# Patient Record
Sex: Male | Born: 1960 | Race: White | Hispanic: No | Marital: Married | Smoking: Former smoker
Health system: Southern US, Community
[De-identification: ages and names within clinical notes are randomized; demographics above are authoritative.]

## PROBLEM LIST (undated history)

## (undated) DIAGNOSIS — K219 Gastro-esophageal reflux disease without esophagitis: Secondary | ICD-10-CM

## (undated) HISTORY — DX: Gastro-esophageal reflux disease without esophagitis: K21.9

---

## 2006-08-24 ENCOUNTER — Inpatient Hospital Stay: Payer: Self-pay | Admitting: General Surgery

## 2006-08-24 ENCOUNTER — Other Ambulatory Visit: Payer: Self-pay

## 2009-01-12 HISTORY — PX: CHOLECYSTECTOMY: SHX55

## 2009-11-12 ENCOUNTER — Ambulatory Visit: Payer: Self-pay | Admitting: Ophthalmology

## 2011-11-03 ENCOUNTER — Emergency Department: Payer: Self-pay | Admitting: Emergency Medicine

## 2011-11-03 LAB — BASIC METABOLIC PANEL
BUN: 14 mg/dL (ref 4–21)
BUN: 14 mg/dL (ref 7–18)
Creatinine: 0.83 mg/dL (ref 0.60–1.30)
EGFR (African American): >60
EGFR (Non-African Amer.): >60
Glucose: 100 mg/dL — ABNORMAL HIGH (ref 65–99)
Potassium: 3.5 mmol/L (ref 3.5–5.1)
Sodium: 138 mmol/L (ref 136–145)

## 2011-11-03 LAB — CBC
HGB: 14.7 g/dL (ref 13.0–18.0)
MCH: 30.9 pg (ref 26.0–34.0)
MCHC: 33.7 g/dL (ref 32.0–36.0)
MCV: 92 fL (ref 80–100)
RDW: 14 % (ref 11.5–14.5)
WBC: 14.5

## 2011-11-17 ENCOUNTER — Encounter: Payer: Self-pay | Admitting: Family Medicine

## 2011-11-17 ENCOUNTER — Ambulatory Visit (INDEPENDENT_AMBULATORY_CARE_PROVIDER_SITE_OTHER): Payer: BC Managed Care – PPO | Admitting: Family Medicine

## 2011-11-17 VITALS — BP 128/88 | HR 84 | Temp 98.6°F | Ht 71.0 in | Wt 166.0 lb

## 2011-11-17 DIAGNOSIS — K047 Periapical abscess without sinus: Secondary | ICD-10-CM | POA: Insufficient documentation

## 2011-11-17 DIAGNOSIS — K219 Gastro-esophageal reflux disease without esophagitis: Secondary | ICD-10-CM

## 2011-11-17 HISTORY — DX: Periapical abscess without sinus: K04.7

## 2011-11-17 MED ORDER — OMEPRAZOLE 40 MG PO CPDR: 40.0000 mg | DELAYED_RELEASE_CAPSULE | Freq: Every day | ORAL | Status: DC

## 2011-11-17 NOTE — Assessment & Plan Note (Signed)
Will request records today and then fill out FMLA forms he brings. Pending extraction 12/01/2011. Was out of work all last week.

## 2011-11-17 NOTE — Assessment & Plan Note (Signed)
No red flags.  Present for 3 yrs. Discussed GERD precautions. Start omeprazole 40mg  daily.  If persistent, consider EGD. Has failed 2 H2 blockers.

## 2011-11-17 NOTE — Progress Notes (Addendum)
Subjective:    Patient ID: Bradley Gonzales, male    DOB: 1960/07/06, 51 y.o.   MRN: [id number]  HPI CC: new pt to establish  No prior PCP.  Used to see Dr. Vear Clock.  Denies h/o HTN.  GERD - has had GERD sxs for last 3 years.  Has tried OTC meds including - pepcid AC, zantac which only help for a limited amt time.  Mother takes prilosec which works well for him.  Denies early satiety, vomiting or dysphagia.  Last week seen at [facility] on Monday evening, treated for sinusitis with 2 different abx on Monday.  Next day woke up with facial swelling around left eye, evaluated at [facility] ER and stayed for 1/2 day with IV abx home with clindamycin, and stayed out of work the whole week.  Head CT showing dental abscess.  Fever to 102.  BP was very elevated as well.  Planning on tooth pulled 12/01/2011 by dentist in [place].  Back to work this week.  Out of work all last week.  Caffeine: pepsi, tea Lives with wife Dondra Spry) and 1 son Joselyn Glassman), 1 dog Occupation: Receiving clerk  Edu: HS Activity: fishes, walking Diet: good water intake, daily fruits/vegetables  Preventative: No recent CPE Flu shot at work (11/12/2011) Tetanus 2011  Medications and allergies reviewed and updated in chart.  Past histories reviewed and updated if relevant as below. There is no problem list on file for this patient.  Past Medical History  Diagnosis Date  . GERD (gastroesophageal reflux disease)    Past Surgical History  Procedure Date  . Cholecystectomy 2011   History  Substance Use Topics  . Smoking status: Former Smoker    Quit date: 01/12/1994  . Smokeless tobacco: Never Used  . Alcohol Use: Yes     Comment: Occasional   Family History  Problem Relation Age of Onset  . Hypertension Mother   . Cancer Mother     ovarian  . Cancer Paternal Grandfather     ?lung, smoker  . Diabetes Paternal Uncle   . Stroke Neg Hx   . CAD Neg Hx    No Known Allergies No current outpatient prescriptions  on file prior to visit.     Review of Systems  Constitutional: Positive for fever and chills. Negative for activity change, appetite change, fatigue and unexpected weight change.  HENT: Positive for dental problem and sinus pressure. Negative for hearing loss, sore throat, trouble swallowing and neck pain.   Eyes: Negative for visual disturbance.  Respiratory: Negative for cough, chest tightness, shortness of breath and wheezing.   Cardiovascular: Negative for chest pain, palpitations and leg swelling.  Gastrointestinal: Negative for nausea, vomiting, abdominal pain, diarrhea, constipation, blood in stool and abdominal distention.  Genitourinary: Negative for hematuria and difficulty urinating.  Musculoskeletal: Negative for myalgias and arthralgias.  Skin: Negative for rash.  Neurological: Negative for dizziness, seizures, syncope and headaches.  Hematological: Does not bruise/bleed easily.  Psychiatric/Behavioral: Negative for dysphoric mood. The patient is not nervous/anxious.        Objective:   Physical Exam  Nursing note and vitals reviewed. Constitutional: He is oriented to person, place, and time. He appears well-developed and well-nourished. No distress.  HENT:  Head: Normocephalic and atraumatic.  Right Ear: Hearing, tympanic membrane, external ear and ear canal normal.  Left Ear: Hearing, tympanic membrane, external ear and ear canal normal.  Nose: Nose normal.  Mouth/Throat: Oropharynx is clear and moist. No oropharyngeal exudate.  Poor dentition.  Upper partial dentures  Eyes: Conjunctivae normal and EOM are normal. Pupils are equal, round, and reactive to light. No scleral icterus.  Neck: Normal range of motion. Neck supple. No thyromegaly present.  Cardiovascular: Normal rate, regular rhythm, normal heart sounds and intact distal pulses.   No murmur heard. Pulses:      Radial pulses are 2+ on the right side, and 2+ on the left side.  Pulmonary/Chest: Effort  normal and breath sounds normal. No respiratory distress. He has no wheezes. He has no rales.  Abdominal: Soft. Bowel sounds are normal. He exhibits no distension and no mass. There is no tenderness. There is no rebound and no guarding.  Musculoskeletal: Normal range of motion. He exhibits no edema.  Lymphadenopathy:    He has no cervical adenopathy.  Neurological: He is alert and oriented to person, place, and time.       CN grossly intact, station and gait intact  Skin: Skin is warm and dry. No rash noted.  Psychiatric: He has a normal mood and affect. His behavior is normal. Judgment and thought content normal.       Assessment & Plan:  ADDENDUM ==> reviewed records from [facility] - Dx with dental abscess, treated with clindamycin, rec f/u with dentist.   CT maxiollofacial - L medial incisor 14mm abscess.  L preseptal cellulitis. WBC 14.5, glu 100.

## 2011-11-17 NOTE — Patient Instructions (Addendum)
Sign release of records form for records from [facility] in [place] and [facility] ER visit. I will then fill out forms. Good to meet you today, return as needed. Return at your convenience fasting for blood work and afterwards for physical.  For reflux: Head of bed elevated. Avoidance of citrus, fatty foods, chocolate, peppermint, and excessive alcohol, along with sodas, orange juice (acidic drinks) At least a few hours between dinner and bed, minimize naps after eating.

## 2011-11-23 ENCOUNTER — Telehealth: Payer: Self-pay

## 2011-11-23 NOTE — Telephone Encounter (Signed)
filled and placed in Kim[employer] box [employer] form has 2 highlighted areas that need to be filled out. Also plz clarify with pt - seen at [facility] ER 11/03/2011, so stayed out of work for 2 wks?  (i thought only 1)

## 2011-11-23 NOTE — Telephone Encounter (Signed)
Pt left v/m requesting paper work left at 11/17/11 for human resources. Call pt with update.

## 2011-11-23 NOTE — Telephone Encounter (Signed)
Spoke with patient and made aware that Dr. Reece Agar out of office and was needing additional info prior to filling out forms. Once that info was received and Dr. Reece Agar back in office, paperwork would be completed and patient would be notified. Patient verbalized understanding and will await call. He did want Dr. Reece Agar to be aware that he found out HTN runs in his family.

## 2011-11-25 NOTE — Telephone Encounter (Signed)
Spoke with patient and he confirmed he was only out of work x 1 week. Is there anything that needs to be changed prior to faxing paperwork?

## 2011-11-25 NOTE — Telephone Encounter (Signed)
No mailbox on patient's cell. Message left for patient's wife to have patient call and advise specific dates that he was out of work due to telling me out 1 week and telling Dr. Reece Agar out 2. Advised to leave message on my VM if I didn't answer. Will await return call.

## 2011-11-25 NOTE — Telephone Encounter (Signed)
Spoke with Bradley Gonzales about this.

## 2011-11-26 NOTE — Telephone Encounter (Signed)
Patient called and said he was out of work from October 21-25. He went to [facility] in [place] on the 21st and then to [facility] on the 22nd which is when the ER doctor put him out of work for the rest of the week.

## 2011-11-26 NOTE — Telephone Encounter (Signed)
Noted. Thanks.  plz fax with change in form.

## 2011-11-26 NOTE — Telephone Encounter (Signed)
Forms faxed to [facility] rep for patient's employer.

## 2011-12-24 ENCOUNTER — Encounter: Payer: Self-pay | Admitting: Family Medicine

## 2012-02-06 ENCOUNTER — Other Ambulatory Visit: Payer: Self-pay | Admitting: Family Medicine

## 2012-02-06 DIAGNOSIS — Z0000 Encounter for general adult medical examination without abnormal findings: Secondary | ICD-10-CM

## 2012-02-11 ENCOUNTER — Other Ambulatory Visit (INDEPENDENT_AMBULATORY_CARE_PROVIDER_SITE_OTHER): Payer: BC Managed Care – PPO

## 2012-02-11 DIAGNOSIS — Z0000 Encounter for general adult medical examination without abnormal findings: Secondary | ICD-10-CM

## 2012-02-11 LAB — LIPID PANEL
Cholesterol: 183 mg/dL (ref 0–200)
LDL Cholesterol: 101 mg/dL — ABNORMAL HIGH (ref 0–99)
Triglycerides: 191 mg/dL — ABNORMAL HIGH (ref 0.0–149.0)

## 2012-02-11 LAB — BASIC METABOLIC PANEL
BUN: 21 mg/dL (ref 6–23)
CO2: 28 mEq/L (ref 19–32)
Chloride: 108 mEq/L (ref 96–112)
Creatinine, Ser: 0.9 mg/dL (ref 0.4–1.5)

## 2012-02-16 ENCOUNTER — Encounter: Payer: Self-pay | Admitting: Family Medicine

## 2012-02-16 ENCOUNTER — Ambulatory Visit (INDEPENDENT_AMBULATORY_CARE_PROVIDER_SITE_OTHER): Payer: BC Managed Care – PPO | Admitting: Family Medicine

## 2012-02-16 VITALS — BP 118/76 | HR 76 | Temp 98.3°F | Ht 71.5 in | Wt 173.2 lb

## 2012-02-16 DIAGNOSIS — K219 Gastro-esophageal reflux disease without esophagitis: Secondary | ICD-10-CM

## 2012-02-16 DIAGNOSIS — Z0000 Encounter for general adult medical examination without abnormal findings: Secondary | ICD-10-CM

## 2012-02-16 DIAGNOSIS — R7309 Other abnormal glucose: Secondary | ICD-10-CM

## 2012-02-16 DIAGNOSIS — R739 Hyperglycemia, unspecified: Secondary | ICD-10-CM | POA: Insufficient documentation

## 2012-02-16 MED ORDER — OMEPRAZOLE 20 MG PO CPDR: 20.0000 mg | DELAYED_RELEASE_CAPSULE | Freq: Every day | ORAL | Status: DC

## 2012-02-16 NOTE — Assessment & Plan Note (Signed)
Great control on omeprazole 40mg  daily - will do trial of lower dose to see if adequate control.

## 2012-02-16 NOTE — Assessment & Plan Note (Signed)
Slight elevated fasting sugar - discussed diminishing added salt.

## 2012-02-16 NOTE — Progress Notes (Signed)
Subjective:    Patient ID: Bradley Gonzales., male    DOB: 1960-02-22, 52 y.o.   MRN: [id number]  HPI CC: CPE  Nosebleeds - sleeps with fan on, electric heater.  Comes and goes.  Goes away after a few minutes.  No other easy bruising or bleeding.  Seat belt and sunscreen use discussed.  Caffeine: pepsi, tea  Lives with wife Dondra Spry) and 1 son Joselyn Glassman), 1 dog  Occupation: Receiving clerk  Edu: HS  Activity: fishes, walking Diet: good water intake, daily fruits/vegetables  Preventative:  No recent CPE  Flu shot at work (11/12/2011)  Tetanus 2011 Colon/prostate cancer screening - no BM changes, no blood in stool.  Medications and allergies reviewed and updated in chart.  Past histories reviewed and updated if relevant as below. Patient Active Problem List  Diagnosis  . GERD (gastroesophageal reflux disease)  . Dental abscess   Past Medical History  Diagnosis Date  . GERD (gastroesophageal reflux disease)    Past Surgical History  Procedure Date  . Cholecystectomy 2011   History  Substance Use Topics  . Smoking status: Former Smoker    Quit date: 01/12/1994  . Smokeless tobacco: Never Used  . Alcohol Use: Yes     Comment: Occasional   Family History  Problem Relation Age of Onset  . Hypertension Mother   . Cancer Mother     ovarian  . Cancer Paternal Grandfather     ?lung, smoker  . Diabetes Paternal Uncle   . Stroke Neg Hx   . CAD Neg Hx    No Known Allergies Current Outpatient Prescriptions on File Prior to Visit  Medication Sig Dispense Refill  . omeprazole (PRILOSEC) 20 MG capsule Take 1 capsule (20 mg total) by mouth daily.  30 capsule  11     Review of Systems  Constitutional: Negative for fever, chills, appetite change, fatigue and unexpected weight change.  HENT: Negative for hearing loss, sore throat, trouble swallowing and neck pain.   Eyes: Negative for visual disturbance.  Respiratory: Negative for cough, chest tightness, shortness of breath  and wheezing.   Cardiovascular: Negative for chest pain, palpitations and leg swelling.  Gastrointestinal: Negative for nausea, vomiting, abdominal pain, diarrhea, constipation, blood in stool and abdominal distention.  Genitourinary: Negative for hematuria and difficulty urinating.  Musculoskeletal: Negative for myalgias and arthralgias.  Skin: Negative for rash.  Neurological: Negative for dizziness, seizures, syncope and headaches.  Hematological: Does not bruise/bleed easily.  Psychiatric/Behavioral: Negative for dysphoric mood. The patient is not nervous/anxious.        Objective:   Physical Exam  Nursing note and vitals reviewed. Constitutional: He is oriented to person, place, and time. He appears well-developed and well-nourished. No distress.  HENT:  Head: Normocephalic and atraumatic.  Right Ear: Hearing, tympanic membrane, external ear and ear canal normal.  Left Ear: Hearing, tympanic membrane, external ear and ear canal normal.  Nose: Nose normal.  Mouth/Throat: Oropharynx is clear and moist. No oropharyngeal exudate.  Eyes: Conjunctivae normal and EOM are normal. Pupils are equal, round, and reactive to light. No scleral icterus.  Neck: Normal range of motion. Neck supple.  Cardiovascular: Normal rate, regular rhythm, normal heart sounds and intact distal pulses.   No murmur heard. Pulses:      Radial pulses are 2+ on the right side, and 2+ on the left side.  Pulmonary/Chest: Effort normal and breath sounds normal. No respiratory distress. He has no wheezes. He has no rales.  Abdominal: Soft.  Bowel sounds are normal. He exhibits no distension and no mass. There is no tenderness. There is no rebound and no guarding.  Musculoskeletal: Normal range of motion. He exhibits no edema.  Lymphadenopathy:    He has no cervical adenopathy.  Neurological: He is alert and oriented to person, place, and time.       CN grossly intact, station and gait intact  Skin: Skin is warm and  dry. No rash noted.  Psychiatric: He has a normal mood and affect. His behavior is normal. Judgment and thought content normal.       Assessment & Plan:

## 2012-02-16 NOTE — Assessment & Plan Note (Signed)
Preventative protocols reviewed and updated unless pt declined. Discussed healthy diet and lifestyle. rtc 1 yr for next CPE.

## 2012-02-16 NOTE — Patient Instructions (Signed)
I'm glad reflux is doing well.  Let's try lower dose of omeprazole 20mg  daily - samples provided today. Sugar was a bit high - watch added sugars. Good to see you today, call us with questions.

## 2012-04-05 ENCOUNTER — Telehealth: Payer: Self-pay | Admitting: Family Medicine

## 2012-04-05 NOTE — Telephone Encounter (Signed)
Patient Information:  Caller Name: T[place]  Phone: [phone]  Patient: Bradley Gonzales, Bradley Gonzales  Gender: Male  DOB: [birth year]  Age: 52 Years  PCP: Eustaquio Boyden [facility])  Office Follow Up:  Does the office need to follow up with this patient?: No  Instructions For The Office: N/A  RN Note:  Patient has sore throat, nasal congestion.  Symptoms  Reason For Call & Symptoms: Cough and sinus congestion  Reviewed Health History In EMR: Yes  Reviewed Medications In EMR: Yes  Reviewed Allergies In EMR: Yes  Reviewed Surgeries / Procedures: Yes  Date of Onset of Symptoms: 04/03/2012  Treatments Tried: alka Seltzer, Dayquil  Treatments Tried Worked: No  Guideline(s) Used:  Colds  Disposition Per Guideline:   Home Care  Reason For Disposition Reached:   Colds with no complications  Advice Given:  For a Stuffy Nose - Use Nasal Washes:  Introduction: Saline (salt water) nasal irrigation (nasal wash) is an effective and simple home remedy for treating stuffy nose and sinus congestion. The nose can be irrigated by pouring, spraying, or squirting salt water into the nose and then letting it run back out.  Step-By-Step Instructions:   Step 1: Lean over a sink.  Step 2: Gently squirt or spray warm salt water into one of your nostrils.  Step 3: Some of the water may run into the back of your throat. Spit this out. If you swallow the salt water it will not hurt you.  Step 4: Blow your nose to clean out the water and mucus.  Step 5: Repeat steps 1-4 for the other nostril. You can do this a couple times a day if it seems to help you.  Humidifier:  If the air in your home is dry, use a cool-mist humidifier  Call Back If:  Difficulty breathing occurs  Nasal discharge lasts more than 10 days  Cough lasts more than 3 weeks  You become worse  Patient Will Follow Care Advice:  YES

## 2012-04-05 NOTE — Telephone Encounter (Signed)
Noted. Thanks.

## 2012-04-06 ENCOUNTER — Encounter: Payer: Self-pay | Admitting: Family Medicine

## 2012-04-06 ENCOUNTER — Ambulatory Visit (INDEPENDENT_AMBULATORY_CARE_PROVIDER_SITE_OTHER): Payer: BC Managed Care – PPO | Admitting: Family Medicine

## 2012-04-06 ENCOUNTER — Encounter: Payer: Self-pay | Admitting: *Deleted

## 2012-04-06 VITALS — BP 134/96 | HR 82 | Temp 99.2°F | Wt 174.0 lb

## 2012-04-06 DIAGNOSIS — J209 Acute bronchitis, unspecified: Secondary | ICD-10-CM | POA: Insufficient documentation

## 2012-04-06 MED ORDER — GUAIFENESIN-CODEINE 100-10 MG/5ML PO SYRP: 5.0000 mL | ORAL_SOLUTION | Freq: Every evening | ORAL | Status: DC | PRN

## 2012-04-06 MED ORDER — AZITHROMYCIN 250 MG PO TABS: ORAL_TABLET | ORAL | Status: DC

## 2012-04-06 NOTE — Patient Instructions (Addendum)
You have a sinus infection, likely viral. Push fluids and plenty of rest. Use ibuprofen for inflammation present today. Nasal saline irrigation or neti pot to help drain sinuses. May use plain mucinex or immediate release guaifenesin with plenty of fluid to help mobilize mucous (this is over the counter) Prescription cough syrup at night time (because it can make you sleepy) If not improving with above or any worsening, fill zpack antibiotic. Let us know if fever >101.5, worsening cough.

## 2012-04-06 NOTE — Assessment & Plan Note (Signed)
With sinusitis component. Treat with supportive care as per instructions. If not improving or any worsening, fill zpack to cover atypical bronchitis. Pt agrees with plan.

## 2012-04-06 NOTE — Progress Notes (Signed)
  Subjective:    Patient ID: Bradley Mayers., male    DOB: 03-14-1960, 52 y.o.   MRN: [id number]  HPI CC: cough, feeling ill  5d h/o sinus congestion, productive cough and spitting up mucous.  Purulent drainage.  Headache, chest congestion.  + PNdrainage.  Having chest burning and tightness with coughing as well as some wheezing   Heavy eyes.    Has tried pseudophed, alka seltzer, neti pot.  No fevers/chills, ST, ear or tooth pain, abd pain, nausea.  No sick contacts at home. No smokers at home. No h/o asthma/COPD/allergies  Past Medical History  Diagnosis Date  . GERD (gastroesophageal reflux disease)      Review of Systems Per HPI    Objective:   Physical Exam  Nursing note and vitals reviewed. Constitutional: He appears well-developed and well-nourished. No distress.  HENT:  Head: Normocephalic and atraumatic.  Right Ear: Hearing, tympanic membrane, external ear and ear canal normal.  Left Ear: Hearing, tympanic membrane, external ear and ear canal normal.  Nose: Mucosal edema present. No rhinorrhea. Right sinus exhibits no maxillary sinus tenderness and no frontal sinus tenderness. Left sinus exhibits no maxillary sinus tenderness and no frontal sinus tenderness.  Mouth/Throat: Uvula is midline and mucous membranes are normal. Posterior oropharyngeal edema and posterior oropharyngeal erythema present. No oropharyngeal exudate or tonsillar abscesses.  fluid behind TMs bilaterally Nasal mucosal erythema and edema  Eyes: Conjunctivae and EOM are normal. Pupils are equal, round, and reactive to light. No scleral icterus.  Neck: Normal range of motion. Neck supple.  Cardiovascular: Normal rate, regular rhythm, normal heart sounds and intact distal pulses.   No murmur heard. Pulmonary/Chest: Effort normal and breath sounds normal. No respiratory distress. He has no wheezes. He has no rales.  clear  Lymphadenopathy:    He has no cervical adenopathy.  Skin: Skin is warm and  dry. No rash noted.      Assessment & Plan:

## 2012-09-22 ENCOUNTER — Ambulatory Visit (INDEPENDENT_AMBULATORY_CARE_PROVIDER_SITE_OTHER): Payer: BC Managed Care – PPO | Admitting: Family Medicine

## 2012-09-22 ENCOUNTER — Encounter: Payer: Self-pay | Admitting: Family Medicine

## 2012-09-22 VITALS — BP 144/100 | HR 72 | Temp 98.9°F | Wt 175.5 lb

## 2012-09-22 DIAGNOSIS — R351 Nocturia: Secondary | ICD-10-CM

## 2012-09-22 DIAGNOSIS — Z125 Encounter for screening for malignant neoplasm of prostate: Secondary | ICD-10-CM

## 2012-09-22 DIAGNOSIS — N400 Enlarged prostate without lower urinary tract symptoms: Secondary | ICD-10-CM

## 2012-09-22 MED ORDER — TAMSULOSIN HCL 0.4 MG PO CAPS: 0.4000 mg | ORAL_CAPSULE | Freq: Every day | ORAL | Status: DC

## 2012-09-22 NOTE — Assessment & Plan Note (Signed)
Describes nocturia x 2-3 times.  No daytime sxs. Check UA next visit if no improvement. Somewhat enlarged prostate today. Trial of flomax.

## 2012-09-22 NOTE — Assessment & Plan Note (Signed)
See above. Trial of flomax. update with effect. Consider cialis as pt endorses ED.

## 2012-09-22 NOTE — Patient Instructions (Addendum)
I think your do have enlarged prostate. Try flomax once daily to help with night urination. Blood test today to check prostate.  Benign Prostatic Hypertrophy  The prostate gland is part of the reproductive system of men. A normal prostate is about the size and shape of a walnut. The prostate gland makes a fluid that is mixed with sperm to make semen. This gland surrounds the urethra and is located in front of the rectum and just below the bladder. The bladder is where urine is stored. The urethra is the tube through which urine passes from the bladder to get out of the body. The prostate grows as a man ages. An enlarged prostate not caused by cancer is called benign prostatic hypertrophy (BPH). This is a common health problem in men over age 50. This condition is a normal part of aging. An enlarged prostate presses on the urethra. This makes it harder to pass urine. In the early stages of enlargement, the bladder can get by with a narrowed urethra by forcing the urine through. If the problem gets worse, medical or surgical treatment may be required.  This condition should be followed by your caregiver. Longstanding back pressure on the kidneys can cause infection. Back pressure and infection can progress to bladder damage and kidney (renal) failure. If needed, your caregiver may refer you to a specialist in kidney and prostate disease (urologist). CAUSES  The exact cause is not known.  SYMPTOMS   You are not able to completely empty your bladder.  Getting up often during the night to urinate.  Need to urinate frequently during the day.  Difficultly in starting urine flow.  Decrease in size and strength of the urine stream.  Dribbling after urination.  Pain on urination (more common with infection).  Inability to pass your water. This needs immediate treatment. DIAGNOSIS  These tests will help your caregiver understand your problem:  Digital rectal exam (DRE). In a rectal exam, your  caregiver checks your prostate by putting a gloved, lubricated finger into the rectum to feel the back of your prostate gland. This exam detects the size of the gland and abnormal lumps or growths.  Urinalysis (exam of the urine). This may include a culture if there is concern about infection.  Prostate Specific Antigen (PSA). This is a blood test used to screen for prostate cancer. It is not used alone for diagnosing prostate cancer.  Rectal ultrasound (sonogram). This test uses sound waves to electronically produce a "picture" of the prostate. It helps examine the prostate gland for cancer. TREATMENT  Mild symptoms may not need treatment. Simple observation and yearly exams may be all that is required. Medications and surgery are options for more severe problems. Your caregiver can help you make an informed decision for what is best. Two classes of medications are available for relief of prostate symptoms:  Medications that shrink the prostate. This helps relieve symptoms.  Uncommon side effects include problems with sexual function.  Medications to relax the muscle of the prostate. This also relieves the obstruction.  Side effects can include dizziness, fatigue, lightheadedness, and retrograde ejaculation (diminished volume of ejaculate). Several types of surgical treatments are available for relief of prostate symptoms:  Transurethral resection of the prostate (TURP). In this treatment, an instrument is inserted through opening at the tip of the penis. It is used to cut away pieces of the inner core of the prostate. The pieces are removed through the same opening of the penis. This removes the  obstruction and helps get rid of the symptoms.  Transurethral incision (TUIP). In this procedure, small cuts are made in the prostate. This lessens the prostates pressure on the urethra.  Transurethral microwave thermotherapy (TUMT). This procedure uses microwaves to create heat. The heat destroys  and removes a small amount of prostate tissue.  Transurethral needle ablation (TUNA). This is a procedure that uses radio frequencies to do the same as TUMT.  Interstitial laser coagulation (ILC). This is a procedure that uses a laser to do the same as TUMT and TUNA.  Transurethral electrovaporization (TUVP). This is a procedure that uses electrodes to do the same as the procedures listed above. Regardless of the method of treatment chosen, you and your caregiver will discuss the options. With this knowledge, you along with your caregiver can decide upon the best treatment for you. SEEK MEDICAL CARE IF:   You develop chills, fever of 100.5 F (38.1 C), or night sweats.  There is unexplained back pain.  Symptoms are not helped by medications prescribed.  You develop medication side effects.  Your urine becomes very dark or has a bad smell. SEEK IMMEDIATE MEDICAL CARE IF:   You are suddenly unable to urinate. This is an emergency. You should be seen immediately.  There are large amounts of blood or clots in the urine.  Your urinary problems become unmanageable.  You develop lightheadedness, severe dizziness, or you feel faint.  You develop moderate to severe low back or flank pain.  You develop chills or fever. Document Released: 12/29/2004 Document Revised: 03/23/2011 Document Reviewed: 09/20/2006 [facility] Patient Information 2014 [place], [place].

## 2012-09-22 NOTE — Progress Notes (Signed)
  Subjective:    Patient ID: Bradley Gonzales., male    DOB: May 31, 1960, 52 y.o.   MRN: [id number]  HPI CC: prostate cancer screening  Got letter from insurance company advising him to come in and have prostate cancer screening.  No family history of prostate cancer. Strong stream. Nocturia x 2-3 per night.  Trouble falling back asleep . Has tried melatonin which didn't help.  Has tried benadryl but that causes back pain.  Avoids drinking prior to bedtime. Does drink Dr Reino Kent but has stopped pepsi.  Past Medical History  Diagnosis Date  . GERD (gastroesophageal reflux disease)     Family History  Problem Relation Age of Onset  . Hypertension Mother   . Cancer Mother     ovarian  . Cancer Paternal Grandfather     ?lung, smoker  . Diabetes Paternal Uncle   . Stroke Neg Hx   . CAD Neg Hx     Review of Systems     Objective:   Physical Exam  Nursing note and vitals reviewed. Genitourinary: Rectum normal. Rectal exam shows no external hemorrhoid, no internal hemorrhoid, no fissure, no mass, no tenderness and anal tone normal. Prostate is enlarged (20-25gm). Prostate is not tender.       Assessment & Plan:

## 2012-09-23 ENCOUNTER — Ambulatory Visit: Payer: BC Managed Care – PPO | Admitting: Family Medicine

## 2012-09-23 ENCOUNTER — Encounter: Payer: Self-pay | Admitting: *Deleted

## 2012-09-23 LAB — PSA: PSA: 0.39 ng/mL (ref 0.10–4.00)

## 2013-01-19 ENCOUNTER — Other Ambulatory Visit: Payer: Self-pay | Admitting: Family Medicine

## 2013-02-21 ENCOUNTER — Other Ambulatory Visit: Payer: Self-pay | Admitting: Family Medicine

## 2013-05-23 ENCOUNTER — Other Ambulatory Visit: Payer: Self-pay | Admitting: Family Medicine

## 2013-06-26 ENCOUNTER — Other Ambulatory Visit: Payer: Self-pay | Admitting: Family Medicine

## 2013-09-25 ENCOUNTER — Other Ambulatory Visit: Payer: Self-pay | Admitting: Family Medicine

## 2013-10-25 ENCOUNTER — Other Ambulatory Visit: Payer: Self-pay | Admitting: Family Medicine

## 2013-11-06 ENCOUNTER — Telehealth: Payer: Self-pay | Admitting: Family Medicine

## 2013-11-06 NOTE — Telephone Encounter (Signed)
Noted. Pt to be seen tomorrow.

## 2013-11-06 NOTE — Telephone Encounter (Signed)
Patient Information:  Caller Name: Bradley Gonzales  Phone: [phone]  Patient: Bradley Gonzales, Bradley Gonzales  Gender: Male  DOB: April 29, 1960  Age: 53 Years  PCP: Ria Bush [facility])  Office Follow Up:  Does the office need to follow up with this patient?: No  Instructions For The Office: N/A   Symptoms  Reason For Call & Symptoms: Pt has a painful swelling that is turning black at his waist - to the right hand side of his midline. He describes it as the size of small egg. He is afeb. There are no red streaks. He did not hit this area at all/no trauma.  Reviewed Health History In EMR: Yes  Reviewed Medications In EMR: Yes  Reviewed Allergies In EMR: Yes  Reviewed Surgeries / Procedures: Yes  Date of Onset of Symptoms: 11/05/2013  Guideline(s) Used:  Skin Lesion - Moles or Growths  Disposition Per Guideline:   See Today in Office  Reason For Disposition Reached:   Looks like a boil, infected sore, or deep ulcer  Advice Given:  N/A  Patient Refused Recommendation:  Patient Will Follow Up With Office Later  Appts are full today. He does not want to go to another location. Requested am appt. RN scheduled him at  09:00 with Dr. Diona Browner

## 2013-11-07 ENCOUNTER — Ambulatory Visit (INDEPENDENT_AMBULATORY_CARE_PROVIDER_SITE_OTHER): Payer: BC Managed Care – PPO | Admitting: Family Medicine

## 2013-11-07 ENCOUNTER — Encounter: Payer: Self-pay | Admitting: Family Medicine

## 2013-11-07 VITALS — BP 110/76 | HR 77 | Temp 98.1°F | Ht 71.5 in | Wt 176.5 lb

## 2013-11-07 DIAGNOSIS — T148 Other injury of unspecified body region: Secondary | ICD-10-CM

## 2013-11-07 DIAGNOSIS — T148XXA Other injury of unspecified body region, initial encounter: Secondary | ICD-10-CM

## 2013-11-07 MED ORDER — OMEPRAZOLE 20 MG PO CPDR: DELAYED_RELEASE_CAPSULE | ORAL | Status: DC

## 2013-11-07 NOTE — Assessment & Plan Note (Signed)
Refilled

## 2013-11-07 NOTE — Patient Instructions (Addendum)
Schedule CPX when able with labs prior with PCP.  Call if any further easy bruising or  Bleeding, unexpected weight loss, night sweats etc.

## 2013-11-07 NOTE — Assessment & Plan Note (Addendum)
Isolated, no risk factors, no red flags. Follow.  Avoid NSAIDs, ASA. Eval with labs if further  bruising easily or bleeding.

## 2013-11-07 NOTE — Progress Notes (Signed)
   Subjective:    Patient ID: Bradley Amel., male    DOB: 06-Mar-1960, 53 y.o.   MRN: [id number]  HPI  53 year old previously healthy healthy male presents with new onset  bruise and sore area x 3 days on right lower abdomen.  Feeling well otherwise. No other easy bruising, no gum or rectal bleeding.  No known injury.  No abdominal pain.  He uses prilosec daily. Had bad headache on Friday 7 days ago, took ibuprofen 400 mg  X 1 . No other supplements.  No history of blood issues. No family blood disorder. Social drinker, 6 pack a week.    Review of Systems  Constitutional: Negative for fever, chills, diaphoresis, activity change, appetite change, fatigue and unexpected weight change.  HENT: Negative for ear pain.   Eyes: Negative for pain.  Respiratory: Negative for shortness of breath.   Cardiovascular: Negative for chest pain, palpitations and leg swelling.  Gastrointestinal: Negative for abdominal pain and anal bleeding.  Genitourinary: Negative for hematuria.       Objective:   Physical Exam  Constitutional: Vital signs are normal. He appears well-developed and well-nourished.  HENT:  Head: Normocephalic.  Right Ear: Hearing normal.  Left Ear: Hearing normal.  Nose: Nose normal.  Mouth/Throat: Oropharynx is clear and moist and mucous membranes are normal.  Neck: Trachea normal. Carotid bruit is not present. No mass and no thyromegaly present.  Cardiovascular: Normal rate, regular rhythm and normal pulses.  Exam reveals no gallop, no distant heart sounds and no friction rub.   No murmur heard. No peripheral edema  Pulmonary/Chest: Effort normal and breath sounds normal. No respiratory distress.  Abdominal: Normal appearance and bowel sounds are normal. There is no hepatosplenomegaly. There is no tenderness. There is no CVA tenderness.  Skin: Skin is warm, dry and intact. No rash noted.     healing contusion right lower abdomen  Psychiatric: He has a normal  mood and affect. His speech is normal and behavior is normal. Thought content normal.          Assessment & Plan:

## 2013-11-07 NOTE — Progress Notes (Signed)
Pre visit review using our clinic review tool, if applicable. No additional management support is needed unless otherwise documented below in the visit note. 

## 2014-03-17 ENCOUNTER — Other Ambulatory Visit: Payer: Self-pay | Admitting: Family Medicine

## 2014-03-17 DIAGNOSIS — E785 Hyperlipidemia, unspecified: Secondary | ICD-10-CM

## 2014-03-17 DIAGNOSIS — R739 Hyperglycemia, unspecified: Secondary | ICD-10-CM

## 2014-03-17 DIAGNOSIS — N400 Enlarged prostate without lower urinary tract symptoms: Secondary | ICD-10-CM

## 2014-03-20 ENCOUNTER — Other Ambulatory Visit (INDEPENDENT_AMBULATORY_CARE_PROVIDER_SITE_OTHER): Payer: BLUE CROSS/BLUE SHIELD

## 2014-03-20 DIAGNOSIS — R739 Hyperglycemia, unspecified: Secondary | ICD-10-CM

## 2014-03-20 DIAGNOSIS — N400 Enlarged prostate without lower urinary tract symptoms: Secondary | ICD-10-CM

## 2014-03-20 DIAGNOSIS — E785 Hyperlipidemia, unspecified: Secondary | ICD-10-CM

## 2014-03-20 LAB — COMPREHENSIVE METABOLIC PANEL
ALBUMIN: 4.7 g/dL (ref 3.5–5.2)
ALK PHOS: 68 U/L (ref 39–117)
ALT: 76 U/L — AB (ref 0–53)
AST: 43 U/L — AB (ref 0–37)
BUN: 16 mg/dL (ref 6–23)
CO2: 30 mEq/L (ref 19–32)
CREATININE: 0.89 mg/dL (ref 0.40–1.50)
Calcium: 9.9 mg/dL (ref 8.4–10.5)
Chloride: 105 mEq/L (ref 96–112)
GFR: 95.57 mL/min (ref >60.00)
Glucose, Bld: 99 mg/dL (ref 70–99)
Potassium: 4.2 mEq/L (ref 3.5–5.1)
Sodium: 140 mEq/L (ref 135–145)
TOTAL PROTEIN: 7.7 g/dL (ref 6.0–8.3)
Total Bilirubin: 0.8 mg/dL (ref 0.2–1.2)

## 2014-03-20 LAB — LIPID PANEL
Cholesterol: 208 mg/dL — ABNORMAL HIGH (ref 0–200)
HDL: 53.4 mg/dL (ref >39.00)
LDL CALC: 127 mg/dL — AB (ref 0–99)
NonHDL: 154.6
Total CHOL/HDL Ratio: 4
Triglycerides: 139 mg/dL (ref 0.0–149.0)
VLDL: 27.8 mg/dL (ref 0.0–40.0)

## 2014-03-20 LAB — HEMOGLOBIN A1C: Hgb A1c MFr Bld: 5.8 % (ref 4.6–6.5)

## 2014-03-20 LAB — PSA: PSA: 0.57 ng/mL (ref 0.10–4.00)

## 2014-03-27 ENCOUNTER — Encounter: Payer: Self-pay | Admitting: Family Medicine

## 2014-03-27 ENCOUNTER — Encounter: Payer: Self-pay | Admitting: *Deleted

## 2014-03-27 ENCOUNTER — Ambulatory Visit (INDEPENDENT_AMBULATORY_CARE_PROVIDER_SITE_OTHER): Payer: BLUE CROSS/BLUE SHIELD | Admitting: Family Medicine

## 2014-03-27 VITALS — BP 124/78 | HR 84 | Temp 98.4°F | Ht 70.25 in | Wt 179.1 lb

## 2014-03-27 DIAGNOSIS — K760 Fatty (change of) liver, not elsewhere classified: Secondary | ICD-10-CM | POA: Insufficient documentation

## 2014-03-27 DIAGNOSIS — N400 Enlarged prostate without lower urinary tract symptoms: Secondary | ICD-10-CM

## 2014-03-27 DIAGNOSIS — R740 Nonspecific elevation of levels of transaminase and lactic acid dehydrogenase [LDH]: Secondary | ICD-10-CM

## 2014-03-27 DIAGNOSIS — Z1211 Encounter for screening for malignant neoplasm of colon: Secondary | ICD-10-CM

## 2014-03-27 DIAGNOSIS — R7401 Elevation of levels of liver transaminase levels: Secondary | ICD-10-CM

## 2014-03-27 DIAGNOSIS — Z0000 Encounter for general adult medical examination without abnormal findings: Secondary | ICD-10-CM

## 2014-03-27 DIAGNOSIS — K219 Gastro-esophageal reflux disease without esophagitis: Secondary | ICD-10-CM

## 2014-03-27 MED ORDER — OMEPRAZOLE 10 MG PO CPDR: DELAYED_RELEASE_CAPSULE | ORAL | Status: DC

## 2014-03-27 NOTE — Progress Notes (Signed)
BP 124/78 mmHg  Pulse 84  Temp(Src) 98.4 F (36.9 C)  Ht 5' 10.25" (1.784 m)  Wt 179 lb 1.9 oz (81.248 kg)  BMI 25.53 kg/m2   CC: CPE  Subjective:    Patient ID: Bradley Gonzales., male    DOB: [birth year], 54 y.o.   MRN: [id number]  HPI: Bradley Gonzales. is a 54 y.o. male presenting on 03/27/2014 for Annual Exam   Not seen since 09/2012 Stopped flomax - ineffective. Persistent nocturia x 2-3.  GERD - controlled with omeprazole 21m daily. No dysphagia, weight changes, nausea/vomiting, early satiety  Preventative: Colon cancer - discussed, would like stool kit.  Prostate cancer screening - DRE with enlarged prostate 09/2012. Requests deferred today. PSA reassuring. Flu shot - not done Tetanus 2011 Seat belt discussed. Sunscreen use discussed - denies changing moles on skin.  Caffeine: pepsi, tea  Lives with wife (Baker Janus and 1 son (Dorothea Ogle, 1 dog  Occupation: Receiving clerk  Edu: HS  Activity: fishes, walking dog daily 15 min Diet: good water intake, daily fruits/vegetables  Relevant past medical, surgical, family and social history reviewed and updated as indicated. Interim medical history since our last visit reviewed. Allergies and medications reviewed and updated. Current Outpatient Prescriptions on File Prior to Visit  Medication Sig  . fish oil-omega-3 fatty acids 1000 MG capsule Take 1 g by mouth daily.   No current facility-administered medications on file prior to visit.    Review of Systems  Constitutional: Negative for fever, chills, activity change, appetite change, fatigue and unexpected weight change.  HENT: Negative for hearing loss.   Eyes: Negative for visual disturbance.  Respiratory: Negative for cough, chest tightness, shortness of breath and wheezing.   Cardiovascular: Negative for chest pain, palpitations and leg swelling.  Gastrointestinal: Negative for nausea, vomiting, abdominal pain, diarrhea, constipation, blood in stool and  abdominal distention.  Genitourinary: Negative for hematuria and difficulty urinating.  Musculoskeletal: Negative for myalgias, arthralgias and neck pain.  Skin: Negative for rash.  Neurological: Negative for dizziness, seizures, syncope and headaches.  Hematological: Negative for adenopathy. Does not bruise/bleed easily.  Psychiatric/Behavioral: Negative for dysphoric mood. The patient is not nervous/anxious.    Per HPI unless specifically indicated above     Objective:    BP 124/78 mmHg  Pulse 84  Temp(Src) 98.4 F (36.9 C)  Ht 5' 10.25" (1.784 m)  Wt 179 lb 1.9 oz (81.248 kg)  BMI 25.53 kg/m2  Wt Readings from Last 3 Encounters:  03/27/14 179 lb 1.9 oz (81.248 kg)  11/07/13 176 lb 8 oz (80.06 kg)  09/22/12 175 lb 8 oz (79.606 kg)    Physical Exam  Constitutional: He is oriented to person, place, and time. He appears well-developed and well-nourished. No distress.  HENT:  Head: Normocephalic and atraumatic.  Right Ear: Hearing, tympanic membrane, external ear and ear canal normal.  Left Ear: Hearing, tympanic membrane, external ear and ear canal normal.  Nose: Nose normal.  Mouth/Throat: Uvula is midline, oropharynx is clear and moist and mucous membranes are normal. No oropharyngeal exudate, posterior oropharyngeal edema or posterior oropharyngeal erythema.  Eyes: Conjunctivae and EOM are normal. Pupils are equal, round, and reactive to light. No scleral icterus.  Neck: Normal range of motion. Neck supple. No thyromegaly present.  Cardiovascular: Normal rate, regular rhythm, normal heart sounds and intact distal pulses.   No murmur heard. Pulses:      Radial pulses are 2+ on the right side, and 2+ on the left  side.  Pulmonary/Chest: Effort normal and breath sounds normal. No respiratory distress. He has no wheezes. He has no rales.  Abdominal: Soft. Bowel sounds are normal. He exhibits no distension and no mass. There is no tenderness. There is no rebound and no guarding.    Genitourinary:  Pt declines  Musculoskeletal: Normal range of motion. He exhibits no edema.  Lymphadenopathy:    He has no cervical adenopathy.  Neurological: He is alert and oriented to person, place, and time.  CN grossly intact, station and gait intact  Skin: Skin is warm and dry. No rash noted.  Psychiatric: He has a normal mood and affect. His behavior is normal. Judgment and thought content normal.  Nursing note and vitals reviewed.  Results for orders placed or performed in visit on 03/20/14  Hemoglobin A1c  Result Value Ref Range   Hgb A1c MFr Bld 5.8 4.6 - 6.5 %  PSA  Result Value Ref Range   PSA 0.57 0.10 - 4.00 ng/mL  Comprehensive metabolic panel  Result Value Ref Range   Sodium 140 135 - 145 mEq/L   Potassium 4.2 3.5 - 5.1 mEq/L   Chloride 105 96 - 112 mEq/L   CO2 30 19 - 32 mEq/L   Glucose, Bld 99 70 - 99 mg/dL   BUN 16 6 - 23 mg/dL   Creatinine, Ser 0.89 0.40 - 1.50 mg/dL   Total Bilirubin 0.8 0.2 - 1.2 mg/dL   Alkaline Phosphatase 68 39 - 117 U/L   AST 43 (H) 0 - 37 U/L   ALT 76 (H) 0 - 53 U/L   Total Protein 7.7 6.0 - 8.3 g/dL   Albumin 4.7 3.5 - 5.2 g/dL   Calcium 9.9 8.4 - 10.5 mg/dL   GFR 95.57 >60.00 mL/min  Lipid panel  Result Value Ref Range   Cholesterol 208 (H) 0 - 200 mg/dL   Triglycerides 139.0 0.0 - 149.0 mg/dL   HDL 53.40 >39.00 mg/dL   VLDL 27.8 0.0 - 40.0 mg/dL   LDL Cholesterol 127 (H) 0 - 99 mg/dL   Total CHOL/HDL Ratio 4    NonHDL 154.60       Assessment & Plan:   Problem List Items Addressed This Visit    Transaminitis    New finding - will ask patient to return in 2 months for further labwork. If persistently elevated, check abd [place]. Denies significant alcohol or tylenol use.      Relevant Orders   Hepatic function panel   IBC panel   TSH   Hepatitis panel, acute   Healthcare maintenance - Primary    Preventative protocols reviewed and updated unless pt declined. Discussed healthy diet and lifestyle.       GERD  (gastroesophageal reflux disease)    Refilled omeprazole 65m daily.      Relevant Medications   omeprazole (PRILOSEC) capsule   BPH (benign prostatic hypertrophy)    Requests to defer DRE today. flomax ineffective. Continue to monitor. PSA reassuring.       Other Visit Diagnoses    Special screening for malignant neoplasms, colon        Relevant Orders    Fecal occult blood, imunochemical        Follow up plan: Return in about 1 year (around 03/27/2015), or as needed, for annual exam, prior fasting for blood work.

## 2014-03-27 NOTE — Assessment & Plan Note (Signed)
Requests to defer DRE today. flomax ineffective. Continue to monitor. PSA reassuring.

## 2014-03-27 NOTE — Assessment & Plan Note (Addendum)
New finding - will ask patient to return in 2 months for further labwork. If persistently elevated, check abd [place]. Denies significant alcohol or tylenol use.

## 2014-03-27 NOTE — Assessment & Plan Note (Signed)
Refilled omeprazole 20 mg daily  

## 2014-03-27 NOTE — Assessment & Plan Note (Signed)
Preventative protocols reviewed and updated unless pt declined. Discussed healthy diet and lifestyle.  

## 2014-03-27 NOTE — Patient Instructions (Signed)
Return in 1 year for physical Return in 2 months for lab visit only. Pass by lab for a stool kit. Good to see you today, call us with questions.

## 2014-03-27 NOTE — Progress Notes (Signed)
Pre visit review using our clinic review tool, if applicable. No additional management support is needed unless otherwise documented below in the visit note. 

## 2014-04-02 ENCOUNTER — Encounter: Payer: Self-pay | Admitting: *Deleted

## 2014-04-02 ENCOUNTER — Other Ambulatory Visit: Payer: BLUE CROSS/BLUE SHIELD

## 2014-04-02 DIAGNOSIS — Z1211 Encounter for screening for malignant neoplasm of colon: Secondary | ICD-10-CM

## 2014-04-02 LAB — FECAL OCCULT BLOOD, GUAIAC: FECAL OCCULT BLD: NEGATIVE

## 2014-04-02 LAB — FECAL OCCULT BLOOD, IMMUNOCHEMICAL: Fecal Occult Bld: NEGATIVE

## 2014-12-03 ENCOUNTER — Other Ambulatory Visit: Payer: Self-pay | Admitting: *Deleted

## 2014-12-03 MED ORDER — OMEPRAZOLE 10 MG PO CPDR: DELAYED_RELEASE_CAPSULE | ORAL | Status: DC

## 2015-06-20 ENCOUNTER — Other Ambulatory Visit: Payer: Self-pay | Admitting: Family Medicine

## 2015-08-16 ENCOUNTER — Encounter: Payer: Self-pay | Admitting: Family Medicine

## 2015-08-16 ENCOUNTER — Ambulatory Visit (INDEPENDENT_AMBULATORY_CARE_PROVIDER_SITE_OTHER): Payer: BLUE CROSS/BLUE SHIELD | Admitting: Family Medicine

## 2015-08-16 VITALS — BP 142/90 | HR 80 | Temp 98.3°F | Wt 176.2 lb

## 2015-08-16 DIAGNOSIS — K219 Gastro-esophageal reflux disease without esophagitis: Secondary | ICD-10-CM | POA: Diagnosis not present

## 2015-08-16 DIAGNOSIS — R0683 Snoring: Secondary | ICD-10-CM | POA: Diagnosis not present

## 2015-08-16 DIAGNOSIS — M25522 Pain in left elbow: Secondary | ICD-10-CM | POA: Insufficient documentation

## 2015-08-16 MED ORDER — OMEPRAZOLE 10 MG PO CPDR: 10.0000 mg | DELAYED_RELEASE_CAPSULE | Freq: Every day | ORAL | 3 refills | Status: DC

## 2015-08-16 NOTE — Assessment & Plan Note (Signed)
Exam benign. Anticipate possible arthralgia at prior fracture site. Suggested tylenol or aleve prn pain, start glucosamine.

## 2015-08-16 NOTE — Assessment & Plan Note (Signed)
Omeprazole 10mg  daily refilled. Discussed trial QOD dosing.

## 2015-08-16 NOTE — Assessment & Plan Note (Signed)
Suggested nasal breathe-right strips for night time. Suggested nasal saline irrigation.

## 2015-08-16 NOTE — Progress Notes (Signed)
Pre visit review using our clinic review tool, if applicable. No additional management support is needed unless otherwise documented below in the visit note. 

## 2015-08-16 NOTE — Progress Notes (Signed)
   BP (!) 142/90   Pulse 80   Temp 98.3 F (36.8 C) (Oral)   Wt 176 lb 4 oz (79.9 kg)   SpO2 97%   BMI 25.11 kg/m    CC: med refill visit Subjective:    Patient ID: Bradley Amel., male    DOB: 21-Oct-1960, 55 y.o.   MRN: [id number]  HPI: Bradley Vieyra. is a 55 y.o. male presenting on 08/16/2015 for Follow-up   Here for med refill visit today.   GERD - daily omeprazole 10mg  daily  Increased snoring. No daytime sleepiness. Endorses restorative sleeping. No apnea or PNDyspnea. Wears partials. Noted increased sinus congestion.   Ongoing L forearm pain worse with lifting. H/o remote trauma with fracture.   Relevant past medical, surgical, family and social history reviewed and updated as indicated. Interim medical history since our last visit reviewed. Allergies and medications reviewed and updated. No current outpatient prescriptions on file prior to visit.   No current facility-administered medications on file prior to visit.     Review of Systems Per HPI unless specifically indicated in ROS section     Objective:    BP (!) 142/90   Pulse 80   Temp 98.3 F (36.8 C) (Oral)   Wt 176 lb 4 oz (79.9 kg)   SpO2 97%   BMI 25.11 kg/m   Wt Readings from Last 3 Encounters:  08/16/15 176 lb 4 oz (79.9 kg)  03/27/14 179 lb 1.9 oz (81.2 kg)  11/07/13 176 lb 8 oz (80.1 kg)    Physical Exam  Constitutional: He appears well-developed and well-nourished. No distress.  HENT:  Mouth/Throat: Oropharynx is clear and moist. No oropharyngeal exudate.  Eyes: Conjunctivae and EOM are normal. Pupils are equal, round, and reactive to light.  Left nasal passage narrowing compared to right with some deviation of septum  Musculoskeletal: He exhibits no edema.  R elbow WNL L elbow FROM without pain to palpation other than nonspecific soreness/ache, no forearm pain.   Nursing note and vitals reviewed.     Assessment & Plan:   Problem List Items Addressed This Visit    GERD  (gastroesophageal reflux disease) - Primary    Omeprazole 10mg  daily refilled. Discussed trial QOD dosing.       Relevant Medications   omeprazole (PRILOSEC) 10 MG capsule   Left elbow pain    Exam benign. Anticipate possible arthralgia at prior fracture site. Suggested tylenol or aleve prn pain, start glucosamine.       Snoring    Suggested nasal breathe-right strips for night time. Suggested nasal saline irrigation.        Other Visit Diagnoses   None.      Follow up plan: No Follow-up on file.  Ria Bush, MD

## 2015-08-16 NOTE — Patient Instructions (Addendum)
Continue omeprazole 10mg  daily - try to space out to every other day.  Try glucosamine over the counter supplement for joint health for left elbow pain. Nasal saline for nose. Try breathe -rite nasal strips for sleep.

## 2015-09-05 ENCOUNTER — Other Ambulatory Visit: Payer: Self-pay | Admitting: Family Medicine

## 2015-09-05 DIAGNOSIS — Z1159 Encounter for screening for other viral diseases: Secondary | ICD-10-CM

## 2015-09-05 DIAGNOSIS — N400 Enlarged prostate without lower urinary tract symptoms: Secondary | ICD-10-CM

## 2015-09-05 DIAGNOSIS — R740 Nonspecific elevation of levels of transaminase and lactic acid dehydrogenase [LDH]: Secondary | ICD-10-CM

## 2015-09-05 DIAGNOSIS — R7401 Elevation of levels of liver transaminase levels: Secondary | ICD-10-CM

## 2015-09-05 DIAGNOSIS — R7303 Prediabetes: Secondary | ICD-10-CM

## 2015-09-05 DIAGNOSIS — E785 Hyperlipidemia, unspecified: Secondary | ICD-10-CM

## 2015-09-06 ENCOUNTER — Other Ambulatory Visit (INDEPENDENT_AMBULATORY_CARE_PROVIDER_SITE_OTHER): Payer: BLUE CROSS/BLUE SHIELD

## 2015-09-06 DIAGNOSIS — Z1159 Encounter for screening for other viral diseases: Secondary | ICD-10-CM

## 2015-09-06 DIAGNOSIS — R740 Nonspecific elevation of levels of transaminase and lactic acid dehydrogenase [LDH]: Secondary | ICD-10-CM | POA: Diagnosis not present

## 2015-09-06 DIAGNOSIS — E785 Hyperlipidemia, unspecified: Secondary | ICD-10-CM

## 2015-09-06 DIAGNOSIS — N400 Enlarged prostate without lower urinary tract symptoms: Secondary | ICD-10-CM

## 2015-09-06 DIAGNOSIS — R7401 Elevation of levels of liver transaminase levels: Secondary | ICD-10-CM

## 2015-09-06 DIAGNOSIS — R7303 Prediabetes: Secondary | ICD-10-CM | POA: Diagnosis not present

## 2015-09-06 LAB — COMPREHENSIVE METABOLIC PANEL
ALK PHOS: 59 U/L (ref 40–115)
ALT: 57 U/L — AB (ref 9–46)
AST: 36 U/L — ABNORMAL HIGH (ref 10–35)
Albumin: 4.3 g/dL (ref 3.6–5.1)
BUN: 13 mg/dL (ref 7–25)
CO2: 24 mmol/L (ref 20–31)
CREATININE: 0.89 mg/dL (ref 0.70–1.33)
Calcium: 9.2 mg/dL (ref 8.6–10.3)
Chloride: 105 mmol/L (ref 98–110)
GLUCOSE: 114 mg/dL — AB (ref 65–99)
Potassium: 3.9 mmol/L (ref 3.5–5.3)
SODIUM: 141 mmol/L (ref 135–146)
TOTAL PROTEIN: 7 g/dL (ref 6.1–8.1)
Total Bilirubin: 0.3 mg/dL (ref 0.2–1.2)

## 2015-09-06 LAB — LIPID PANEL
Cholesterol: 159 mg/dL (ref 125–200)
HDL: 41 mg/dL (ref >=40)
LDL CALC: 66 mg/dL (ref <130)
Total CHOL/HDL Ratio: 3.9 Ratio (ref <=5.0)
Triglycerides: 262 mg/dL — ABNORMAL HIGH (ref <150)
VLDL: 52 mg/dL — ABNORMAL HIGH (ref <30)

## 2015-09-06 NOTE — Addendum Note (Signed)
Addended by: Ellamae Sia on: 09/06/2015 04:27 PM   Modules accepted: Orders

## 2015-09-07 LAB — HEMOGLOBIN A1C
HEMOGLOBIN A1C: 5.6 % (ref <5.7)
Mean Plasma Glucose: 114 mg/dL

## 2015-09-07 LAB — HEPATITIS C ANTIBODY: HCV Ab: NEGATIVE

## 2015-09-07 LAB — PSA: PSA: 0.4 ng/mL (ref <=4.0)

## 2015-09-09 ENCOUNTER — Encounter: Payer: Self-pay | Admitting: Family Medicine

## 2015-09-09 ENCOUNTER — Ambulatory Visit (INDEPENDENT_AMBULATORY_CARE_PROVIDER_SITE_OTHER): Payer: BLUE CROSS/BLUE SHIELD | Admitting: Family Medicine

## 2015-09-09 VITALS — BP 150/100 | HR 64 | Temp 98.7°F | Ht 70.25 in | Wt 178.2 lb

## 2015-09-09 DIAGNOSIS — E785 Hyperlipidemia, unspecified: Secondary | ICD-10-CM

## 2015-09-09 DIAGNOSIS — N400 Enlarged prostate without lower urinary tract symptoms: Secondary | ICD-10-CM

## 2015-09-09 DIAGNOSIS — IMO0001 Reserved for inherently not codable concepts without codable children: Secondary | ICD-10-CM

## 2015-09-09 DIAGNOSIS — R7401 Elevation of levels of liver transaminase levels: Secondary | ICD-10-CM

## 2015-09-09 DIAGNOSIS — Z0000 Encounter for general adult medical examination without abnormal findings: Secondary | ICD-10-CM | POA: Diagnosis not present

## 2015-09-09 DIAGNOSIS — R740 Nonspecific elevation of levels of transaminase and lactic acid dehydrogenase [LDH]: Secondary | ICD-10-CM

## 2015-09-09 DIAGNOSIS — R0683 Snoring: Secondary | ICD-10-CM | POA: Diagnosis not present

## 2015-09-09 DIAGNOSIS — R030 Elevated blood-pressure reading, without diagnosis of hypertension: Secondary | ICD-10-CM | POA: Insufficient documentation

## 2015-09-09 NOTE — Assessment & Plan Note (Signed)
Chronic, improved. Continues slowly decreasing alcohol use.

## 2015-09-09 NOTE — Assessment & Plan Note (Signed)
Ongoing - pt will let me know if desires further eval with ENT.

## 2015-09-09 NOTE — Progress Notes (Signed)
BP (!) 150/100 (BP Location: Right Arm, Cuff Size: Small)   Pulse 64   Temp 98.7 F (37.1 C) (Oral)   Ht 5' 10.25" (1.784 m)   Wt 178 lb 4 oz (80.9 kg)   BMI 25.39 kg/m    CC: CPE Subjective:    Patient ID: Bradley Gonzales., male    DOB: [birth year], 55 y.o.   MRN: [id number]  HPI: Bradley Gonzales. is a 55 y.o. male presenting on 09/09/2015 for Annual Exam   BP elevated today - pt will start monitoring more closely at home.  Preventative: Colon cancer - discussed, would like stool kit.  Prostate cancer screening - DRE with enlarged prostate 09/2012. Requests deferred today. PSA reassuring. Some nocturia.  Flu shot - declines Tetanus 2011 Seat belt discussed.  Sunscreen use discussed - denies changing moles on skin.  Non smoker.  Alcohol use - 3-4 shots tequila/day.  Caffeine: pepsi, tea  Lives with wife Baker Janus) and 1 son [name], 1 dog  Occupation: Receiving clerk  Edu: HS  Activity: fishes, walking dog daily 20 min  Diet: good water intake, daily fruits/vegetables   Relevant past medical, surgical, family and social history reviewed and updated as indicated. Interim medical history since our last visit reviewed. Allergies and medications reviewed and updated. Current Outpatient Prescriptions on File Prior to Visit  Medication Sig  . Flaxseed, Linseed, (FLAX SEED OIL PO) Take by mouth.  Marland Kitchen omeprazole (PRILOSEC) 10 MG capsule Take 1 capsule (10 mg total) by mouth daily.   No current facility-administered medications on file prior to visit.     Review of Systems  Constitutional: Negative for activity change, appetite change, chills, fatigue, fever and unexpected weight change.  HENT: Negative for hearing loss.   Eyes: Negative for visual disturbance.  Respiratory: Negative for cough, chest tightness, shortness of breath and wheezing.   Cardiovascular: Negative for chest pain, palpitations and leg swelling.  Gastrointestinal: Negative for abdominal distention,  abdominal pain, blood in stool, constipation, diarrhea, nausea and vomiting.  Genitourinary: Negative for difficulty urinating and hematuria.  Musculoskeletal: Negative for arthralgias, myalgias and neck pain.  Skin: Negative for rash.  Neurological: Negative for dizziness, seizures, syncope and headaches.  Hematological: Negative for adenopathy. Does not bruise/bleed easily.  Psychiatric/Behavioral: Negative for dysphoric mood. The patient is not nervous/anxious.    Per HPI unless specifically indicated in ROS section     Objective:    BP (!) 150/100 (BP Location: Right Arm, Cuff Size: Small)   Pulse 64   Temp 98.7 F (37.1 C) (Oral)   Ht 5' 10.25" (1.784 m)   Wt 178 lb 4 oz (80.9 kg)   BMI 25.39 kg/m   Wt Readings from Last 3 Encounters:  09/09/15 178 lb 4 oz (80.9 kg)  08/16/15 176 lb 4 oz (79.9 kg)  03/27/14 179 lb 1.9 oz (81.2 kg)    Physical Exam  Constitutional: He is oriented to person, place, and time. He appears well-developed and well-nourished. No distress.  HENT:  Head: Normocephalic and atraumatic.  Right Ear: Hearing, tympanic membrane, external ear and ear canal normal.  Left Ear: Hearing, tympanic membrane, external ear and ear canal normal.  Nose: Nose normal.  Mouth/Throat: Uvula is midline, oropharynx is clear and moist and mucous membranes are normal. No oropharyngeal exudate, posterior oropharyngeal edema or posterior oropharyngeal erythema.  Eyes: Conjunctivae and EOM are normal. Pupils are equal, round, and reactive to light. No scleral icterus.  Neck: Normal range of motion.  Neck supple. No thyromegaly present.  Cardiovascular: Normal rate, regular rhythm, normal heart sounds and intact distal pulses.   No murmur heard. Pulses:      Radial pulses are 2+ on the right side, and 2+ on the left side.  Pulmonary/Chest: Effort normal and breath sounds normal. No respiratory distress. He has no wheezes. He has no rales.  Abdominal: Soft. Bowel sounds are  normal. He exhibits no distension and no mass. There is no tenderness. There is no rebound and no guarding.  Genitourinary:  Genitourinary Comments: DRE - declines  Musculoskeletal: Normal range of motion. He exhibits no edema.  Lymphadenopathy:    He has no cervical adenopathy.  Neurological: He is alert and oriented to person, place, and time.  CN grossly intact, station and gait intact  Skin: Skin is warm and dry. No rash noted.  Psychiatric: He has a normal mood and affect. His behavior is normal. Judgment and thought content normal.  Nursing note and vitals reviewed.  Results for orders placed or performed in visit on 09/06/15  Hepatitis C antibody  Result Value Ref Range   HCV Ab NEGATIVE NEGATIVE  Lipid panel  Result Value Ref Range   Cholesterol 159 125 - 200 mg/dL   Triglycerides 262 (H) <150 mg/dL   HDL 41 >=40 mg/dL   Total CHOL/HDL Ratio 3.9 <=5.0 Ratio   VLDL 52 (H) <30 mg/dL   LDL Cholesterol 66 <130 mg/dL  Comprehensive metabolic panel  Result Value Ref Range   Sodium 141 135 - 146 mmol/L   Potassium 3.9 3.5 - 5.3 mmol/L   Chloride 105 98 - 110 mmol/L   CO2 24 20 - 31 mmol/L   Glucose, Bld 114 (H) 65 - 99 mg/dL   BUN 13 7 - 25 mg/dL   Creat 0.89 0.70 - 1.33 mg/dL   Total Bilirubin 0.3 0.2 - 1.2 mg/dL   Alkaline Phosphatase 59 40 - 115 U/L   AST 36 (H) 10 - 35 U/L   ALT 57 (H) 9 - 46 U/L   Total Protein 7.0 6.1 - 8.1 g/dL   Albumin 4.3 3.6 - 5.1 g/dL   Calcium 9.2 8.6 - 10.3 mg/dL  Hemoglobin A1c  Result Value Ref Range   Hgb A1c MFr Bld 5.6 <5.7 %   Mean Plasma Glucose 114 mg/dL  PSA  Result Value Ref Range   PSA 0.4 <=4.0 ng/mL      Assessment & Plan:   Problem List Items Addressed This Visit    BPH (benign prostatic hypertrophy)    PSA reassuring. Declines DRE.  Cranberry juice has helped his nocturia.       Elevated blood pressure    Elevated today, new. Pt was somewhat nervous coming in. Discussed diet changes to improve blood pressures. Pt  will start monitoring bp at home with home BP cuff and keep log. If consistently >140/90, agrees to return in 1-2 mo for f/u office visit.       Healthcare maintenance - Primary    Preventative protocols reviewed and updated unless pt declined. Discussed healthy diet and lifestyle.       HLD (hyperlipidemia)    Reviewed with patient dietary changes to improve triglycerides. Not fasting reading - will reassess next visit.       Snoring    Ongoing - pt will let me know if desires further eval with ENT.       Transaminitis    Chronic, improved. Continues slowly decreasing alcohol use.  Other Visit Diagnoses   None.      Follow up plan: Return in about 1 year (around 09/08/2016), or as needed, for annual exam, prior fasting for blood work.  Ria Bush, MD

## 2015-09-09 NOTE — Progress Notes (Signed)
Pre visit review using our clinic review tool, if applicable. No additional management support is needed unless otherwise documented below in the visit note. 

## 2015-09-09 NOTE — Assessment & Plan Note (Signed)
Reviewed with patient dietary changes to improve triglycerides. Not fasting reading - will reassess next visit.

## 2015-09-09 NOTE — Patient Instructions (Addendum)
Start checking blood pressure at home and keep a log - check morning or at night. If consistently >140/90, let me know.  In interim, work on increased water, less salt, more fruits/vegetables/fiber.  Pass by lab to pick up stool kit. Consider adding b12 vitamin over the counter. Ok to take vitamin D as well. Return as needed or in 1 year for next physical.  Health Maintenance, Male A healthy lifestyle and preventative care can promote health and wellness.  Maintain regular health, dental, and eye exams.  Eat a healthy diet. Foods like vegetables, fruits, whole grains, low-fat dairy products, and lean protein foods contain the nutrients you need and are low in calories. Decrease your intake of foods high in solid fats, added sugars, and salt. Get information about a proper diet from your health care provider, if necessary.  Regular physical exercise is one of the most important things you can do for your health. Most adults should get at least 150 minutes of moderate-intensity exercise (any activity that increases your heart rate and causes you to sweat) each week. In addition, most adults need muscle-strengthening exercises on 2 or more days a week.   Maintain a healthy weight. The body mass index (BMI) is a screening tool to identify possible weight problems. It provides an estimate of body fat based on height and weight. Your health care provider can find your BMI and can help you achieve or maintain a healthy weight. For males 20 years and older:  A BMI below 18.5 is considered underweight.  A BMI of 18.5 to 24.9 is normal.  A BMI of 25 to 29.9 is considered overweight.  A BMI of 30 and above is considered obese.  Maintain normal blood lipids and cholesterol by exercising and minimizing your intake of saturated fat. Eat a balanced diet with plenty of fruits and vegetables. Blood tests for lipids and cholesterol should begin at age 85 and be repeated every 5 years. If your lipid or  cholesterol levels are high, you are over age 18, or you are at high risk for heart disease, you may need your cholesterol levels checked more frequently.Ongoing high lipid and cholesterol levels should be treated with medicines if diet and exercise are not working.  If you smoke, find out from your health care provider how to quit. If you do not use tobacco, do not start.  Lung cancer screening is recommended for adults aged 55-80 years who are at high risk for developing lung cancer because of a history of smoking. A yearly low-dose CT scan of the lungs is recommended for people who have at least a 30-pack-year history of smoking and are current smokers or have quit within the past 15 years. A pack year of smoking is smoking an average of 1 pack of cigarettes a day for 1 year (for example, a 30-pack-year history of smoking could mean smoking 1 pack a day for 30 years or 2 packs a day for 15 years). Yearly screening should continue until the smoker has stopped smoking for at least 15 years. Yearly screening should be stopped for people who develop a health problem that would prevent them from having lung cancer treatment.  If you choose to drink alcohol, do not have more than 2 drinks per day. One drink is considered to be 12 oz (360 mL) of beer, 5 oz (150 mL) of wine, or 1.5 oz (45 mL) of liquor.  Avoid the use of street drugs. Do not share needles with anyone.  Ask for help if you need support or instructions about stopping the use of drugs.  High blood pressure causes heart disease and increases the risk of stroke. High blood pressure is more likely to develop in:  People who have blood pressure in the end of the normal range (100-139/85-89 mm Hg).  People who are overweight or obese.  People who are African American.  If you are 54-54 years of age, have your blood pressure checked every 3-5 years. If you are 40 years of age or older, have your blood pressure checked every year. You should have  your blood pressure measured twice--once when you are at a hospital or clinic, and once when you are not at a hospital or clinic. Record the average of the two measurements. To check your blood pressure when you are not at a hospital or clinic, you can use:  An automated blood pressure machine at a pharmacy.  A home blood pressure monitor.  If you are 105-82 years old, ask your health care provider if you should take aspirin to prevent heart disease.  Diabetes screening involves taking a blood sample to check your fasting blood sugar level. This should be done once every 3 years after age 58 if you are at a normal weight and without risk factors for diabetes. Testing should be considered at a younger age or be carried out more frequently if you are overweight and have at least 1 risk factor for diabetes.  Colorectal cancer can be detected and often prevented. Most routine colorectal cancer screening begins at the age of 34 and continues through age 23. However, your health care provider may recommend screening at an earlier age if you have risk factors for colon cancer. On a yearly basis, your health care provider may provide home test kits to check for hidden blood in the stool. A small camera at the end of a tube may be used to directly examine the colon (sigmoidoscopy or colonoscopy) to detect the earliest forms of colorectal cancer. Talk to your health care provider about this at age 20 when routine screening begins. A direct exam of the colon should be repeated every 5-10 years through age 49, unless early forms of precancerous polyps or small growths are found.  People who are at an increased risk for hepatitis B should be screened for this virus. You are considered at high risk for hepatitis B if:  You were born in a country where hepatitis B occurs often. Talk with your health care provider about which countries are considered high risk.  Your parents were born in a high-risk country and you  have not received a shot to protect against hepatitis B (hepatitis B vaccine).  You have HIV or AIDS.  You use needles to inject street drugs.  You live with, or have sex with, someone who has hepatitis B.  You are a man who has sex with other men (MSM).  You get hemodialysis treatment.  You take certain medicines for conditions like cancer, organ transplantation, and autoimmune conditions.  Hepatitis C blood testing is recommended for all people born from [birth year] through 1963 and any individual with known risk factors for hepatitis C.  Healthy men should no longer receive prostate-specific antigen (PSA) blood tests as part of routine cancer screening. Talk to your health care provider about prostate cancer screening.  Testicular cancer screening is not recommended for adolescents or adult males who have no symptoms. Screening includes self-exam, a health care provider exam, and  other screening tests. Consult with your health care provider about any symptoms you have or any concerns you have about testicular cancer.  Practice safe sex. Use condoms and avoid high-risk sexual practices to reduce the spread of sexually transmitted infections (STIs).  You should be screened for STIs, including gonorrhea and chlamydia if:  You are sexually active and are younger than 24 years.  You are older than 24 years, and your health care provider tells you that you are at risk for this type of infection.  Your sexual activity has changed since you were last screened, and you are at an increased risk for chlamydia or gonorrhea. Ask your health care provider if you are at risk.  If you are at risk of being infected with HIV, it is recommended that you take a prescription medicine daily to prevent HIV infection. This is called pre-exposure prophylaxis (PrEP). You are considered at risk if:  You are a man who has sex with other men (MSM).  You are a heterosexual man who is sexually active with multiple  partners.  You take drugs by injection.  You are sexually active with a partner who has HIV.  Talk with your health care provider about whether you are at high risk of being infected with HIV. If you choose to begin PrEP, you should first be tested for HIV. You should then be tested every 3 months for as long as you are taking PrEP.  Use sunscreen. Apply sunscreen liberally and repeatedly throughout the day. You should seek shade when your shadow is shorter than you. Protect yourself by wearing long sleeves, pants, a wide-brimmed hat, and sunglasses year round whenever you are outdoors.  Tell your health care provider of new moles or changes in moles, especially if there is a change in shape or color. Also, tell your health care provider if a mole is larger than the size of a pencil eraser.  A one-time screening for abdominal aortic aneurysm (AAA) and surgical repair of large AAAs by ultrasound is recommended for men aged 28-75 years who are current or former smokers.  Stay current with your vaccines (immunizations).   This information is not intended to replace advice given to you by your health care provider. Make sure you discuss any questions you have with your health care provider.   Document Released: 06/27/2007 Document Revised: 01/19/2014 Document Reviewed: 05/26/2010 Elsevier Interactive Patient Education [employer].

## 2015-09-09 NOTE — Assessment & Plan Note (Signed)
Elevated today, new. Pt was somewhat nervous coming in. Discussed diet changes to improve blood pressures. Pt will start monitoring bp at home with home BP cuff and keep log. If consistently >140/90, agrees to return in 1-2 mo for f/u office visit.

## 2015-09-09 NOTE — Assessment & Plan Note (Signed)
Preventative protocols reviewed and updated unless pt declined. Discussed healthy diet and lifestyle.  

## 2015-09-09 NOTE — Assessment & Plan Note (Addendum)
PSA reassuring. Declines DRE.  Cranberry juice has helped his nocturia.

## 2016-10-10 ENCOUNTER — Encounter: Payer: Self-pay | Admitting: Emergency Medicine

## 2016-10-10 ENCOUNTER — Other Ambulatory Visit: Payer: Self-pay

## 2016-10-10 ENCOUNTER — Emergency Department
Admission: EM | Admit: 2016-10-10 | Discharge: 2016-10-10 | Disposition: A | Discharge status: Home / Self Care | Payer: BLUE CROSS/BLUE SHIELD | Attending: Emergency Medicine | Admitting: Emergency Medicine

## 2016-10-10 ENCOUNTER — Emergency Department: Payer: BLUE CROSS/BLUE SHIELD

## 2016-10-10 DIAGNOSIS — Z87891 Personal history of nicotine dependence: Secondary | ICD-10-CM | POA: Diagnosis not present

## 2016-10-10 DIAGNOSIS — R0789 Other chest pain: Secondary | ICD-10-CM | POA: Diagnosis not present

## 2016-10-10 DIAGNOSIS — R079 Chest pain, unspecified: Secondary | ICD-10-CM | POA: Diagnosis not present

## 2016-10-10 DIAGNOSIS — I10 Essential (primary) hypertension: Secondary | ICD-10-CM | POA: Diagnosis not present

## 2016-10-10 DIAGNOSIS — Z79899 Other long term (current) drug therapy: Secondary | ICD-10-CM | POA: Insufficient documentation

## 2016-10-10 LAB — COMPREHENSIVE METABOLIC PANEL
ALK PHOS: 72 U/L (ref 38–126)
ALT: 75 U/L — ABNORMAL HIGH (ref 17–63)
ANION GAP: 8 (ref 5–15)
AST: 49 U/L — ABNORMAL HIGH (ref 15–41)
Albumin: 4.2 g/dL (ref 3.5–5.0)
BILIRUBIN TOTAL: 0.7 mg/dL (ref 0.3–1.2)
BUN: 13 mg/dL (ref 6–20)
CO2: 24 mmol/L (ref 22–32)
Calcium: 8.8 mg/dL — ABNORMAL LOW (ref 8.9–10.3)
Chloride: 109 mmol/L (ref 101–111)
Creatinine, Ser: 0.81 mg/dL (ref 0.61–1.24)
GFR calc Af Amer: >60 mL/min (ref >60)
Glucose, Bld: 122 mg/dL — ABNORMAL HIGH (ref 65–99)
POTASSIUM: 3.6 mmol/L (ref 3.5–5.1)
Sodium: 141 mmol/L (ref 135–145)
TOTAL PROTEIN: 7.8 g/dL (ref 6.5–8.1)

## 2016-10-10 LAB — CBC
HEMATOCRIT: 42.4 % (ref 40.0–52.0)
Hemoglobin: 14.1 g/dL (ref 13.0–18.0)
MCH: 29.7 pg (ref 26.0–34.0)
MCHC: 33.3 g/dL (ref 32.0–36.0)
MCV: 89.1 fL (ref 80.0–100.0)
PLATELETS: 355 10*3/uL (ref 150–440)
RBC: 4.76 MIL/uL (ref 4.40–5.90)
RDW: 18.7 % — AB (ref 11.5–14.5)
WBC: 9.4 10*3/uL (ref 3.8–10.6)

## 2016-10-10 LAB — TROPONIN I: Troponin I: <0.03 ng/mL (ref <0.03)

## 2016-10-10 MED ORDER — CYCLOBENZAPRINE HCL 10 MG PO TABS: 10.0000 mg | ORAL_TABLET | Freq: Three times a day (TID) | ORAL | 0 refills | Status: DC | PRN

## 2016-10-10 MED ORDER — HYDROCODONE-ACETAMINOPHEN 5-325 MG PO TABS
1.0000 | ORAL_TABLET | Freq: Once | ORAL | Status: AC
  Administered 2016-10-10: 1 via ORAL
  Filled 2016-10-10: qty 1

## 2016-10-10 MED ORDER — IBUPROFEN 800 MG PO TABS
800.0000 mg | ORAL_TABLET | Freq: Once | ORAL | Status: AC
Start: 2016-10-10 — End: 2016-10-10
  Administered 2016-10-10: 800 mg via ORAL
  Filled 2016-10-10: qty 1

## 2016-10-10 MED ORDER — HYDROCHLOROTHIAZIDE 12.5 MG PO TABS: 12.5000 mg | ORAL_TABLET | Freq: Every day | ORAL | 0 refills | Status: DC

## 2016-10-10 MED ORDER — IBUPROFEN 800 MG PO TABS: 800.0000 mg | ORAL_TABLET | Freq: Three times a day (TID) | ORAL | 0 refills | Status: DC | PRN

## 2016-10-10 MED ORDER — CYCLOBENZAPRINE HCL 10 MG PO TABS
5.0000 mg | ORAL_TABLET | Freq: Once | ORAL | Status: AC
Start: 2016-10-10 — End: 2016-10-10
  Administered 2016-10-10: 5 mg via ORAL
  Filled 2016-10-10: qty 1

## 2016-10-10 MED ORDER — HYDROCHLOROTHIAZIDE 25 MG PO TABS
12.5000 mg | ORAL_TABLET | Freq: Once | ORAL | Status: AC
  Administered 2016-10-10: 12.5 mg via ORAL
  Filled 2016-10-10: qty 1

## 2016-10-10 NOTE — Discharge Instructions (Signed)
You were evaluated for left-sided chest pain, and as we discussed although no certain cause was found, your exam and evaluation are overall reassuring in the emergency department today.  Return to the emergency department immediately for any worsening or uncontrolled chest pain, trouble breathing, shortness of breath, vomiting or coughing up blood, fever, abdominal pain, numbness, weakness, dizziness, passing out, or any other symptoms concerning to you.  You are started on a low-dose blood pressure medicine for elevated blood pressures. Please follow-up with your primary care doctor for reevaluation and adjustments.  Please call the cardiology's office for an appointment on Monday or Tuesday.

## 2016-10-10 NOTE — ED Provider Notes (Signed)
[facility] Emergency Department Provider Note ____________________________________________   I have reviewed the triage vital signs and the triage nursing note.  HISTORY  Chief Complaint Chest Pain   Historian Patient and wife in the room  HPI Bradley Gonzales. is a 56 y.o. male presents for evaluation of left-sided chest discomfort since last night. Patient noticed last night when he woke up from taking a nap on the couch. Pain is worse with movement of the trunk and when he is upright and walking. Pain does not radiate into the jaw or the back. No specific shortness of breath or nausea or sweats. No indigestion symptoms. No history of prior chest pain. No history of exertional chest discomfort.  Patient states 2 days ago he had a stressful day at work where he was threatened with getting fired, he also did a fair amount of lifting and physical activity which was somewhat unusual but at the time he did not feel pain.    Past Medical History:  Diagnosis Date  . GERD (gastroesophageal reflux disease)     Patient Active Problem List   Diagnosis Date Noted  . Elevated blood pressure 09/09/2015  . HLD (hyperlipidemia) 09/05/2015  . Left elbow pain 08/16/2015  . Snoring 08/16/2015  . Transaminitis 03/27/2014  . Nocturia 09/22/2012  . BPH (benign prostatic hypertrophy) 09/22/2012  . Healthcare maintenance 02/16/2012  . Dental abscess 11/17/2011  . GERD (gastroesophageal reflux disease)     Past Surgical History:  Procedure Laterality Date  . CHOLECYSTECTOMY  2011    Prior to Admission medications   Medication Sig Start Date End Date Taking? Authorizing Provider  cyclobenzaprine (FLEXERIL) 10 MG tablet Take 1 tablet (10 mg total) by mouth 3 (three) times daily as needed for muscle spasms. 10/10/16   Lisa Roca, MD  Flaxseed, Linseed, (FLAX SEED OIL PO) Take by mouth.    [provider]  glucosamine-chondroitin 500-400 MG tablet Take 1  tablet by mouth 3 (three) times daily.    [provider]  hydrochlorothiazide (HYDRODIURIL) 12.5 MG tablet Take 1 tablet (12.5 mg total) by mouth daily. 10/10/16   Lisa Roca, MD  ibuprofen (ADVIL,MOTRIN) 800 MG tablet Take 1 tablet (800 mg total) by mouth every 8 (eight) hours as needed. 10/10/16   Lisa Roca, MD  omeprazole (PRILOSEC) 10 MG capsule Take 1 capsule (10 mg total) by mouth daily. 08/16/15   Ria Bush, MD    No Known Allergies  Family History  Problem Relation Age of Onset  . Hypertension Mother   . Cancer Mother        ovarian  . Cancer Paternal Grandfather        ?lung, smoker  . Diabetes Paternal Uncle   . Stroke Neg Hx   . CAD Neg Hx     Social History Social History  Substance Use Topics  . Smoking status: Former Smoker    Quit date: 01/12/1994  . Smokeless tobacco: Never Used  . Alcohol use Yes     Comment: Occasional    Review of Systems  Constitutional: Negative for fever. Eyes: Negative for visual changes. ENT: Negative for sore throat. Cardiovascular: positive for chest pain. Respiratory: Negative for shortness of breath. Gastrointestinal: Negative for abdominal pain, vomiting and diarrhea. Genitourinary: Negative for dysuria. Musculoskeletal: Negative for back pain. Skin: Negative for rash. Neurological: Negative for headache.  ____________________________________________   PHYSICAL EXAM:  VITAL SIGNS: ED Triage Vitals  Enc Vitals Group     BP 10/10/16 0710 Marland Kitchen)  132/92     Pulse Rate 10/10/16 0710 94     Resp 10/10/16 0710 20     Temp 10/10/16 0710 98.6 F (37 C)     Temp Source 10/10/16 0710 Oral     SpO2 10/10/16 0710 96 %     Weight 10/10/16 0712 177 lb (80.3 kg)     Height 10/10/16 0712 5\' 11"  (1.803 m)     Head Circumference --      Peak Flow --      Pain Score 10/10/16 0710 6     Pain Loc --      Pain Edu? --      Excl. in [place]? --      Constitutional: Alert and oriented. Well appearing and in no  distress. HEENT   Head: Normocephalic and atraumatic.      Eyes: Conjunctivae are normal. Pupils equal and round.       Ears:         Nose: No congestion/rhinnorhea.   Mouth/Throat: Mucous membranes are moist.   Neck: No stridor. Cardiovascular/Chest: Normal rate, regular rhythm.  No murmurs, rubs, or gallops.  chest looks normal, but he does have some tenderness across the left lateral chest wall and pectoralis. Respiratory: Normal respiratory effort without tachypnea nor retractions. Breath sounds are clear and equal bilaterally. No wheezes/rales/rhonchi. Gastrointestinal: Soft. No distention, no guarding, no rebound. Nontender.    Genitourinary/rectal:Deferred Musculoskeletal: Nontender with normal range of motion in all extremities. No joint effusions.  No lower extremity tenderness.  No edema. Neurologic:  Normal speech and language. No gross or focal neurologic deficits are appreciated. Skin:  Skin is warm, dry and intact. No rash noted. Psychiatric: Mood and affect are normal. Speech and behavior are normal. Patient exhibits appropriate insight and judgment.   ____________________________________________  LABS (pertinent positives/negatives) I, Lisa Roca, MD the attending physician have reviewed the labs noted below.  Labs Reviewed  CBC - Abnormal; Notable for the following:       Result Value   RDW 18.7 (*)    All other components within normal limits  COMPREHENSIVE METABOLIC PANEL - Abnormal; Notable for the following:    Glucose, Bld 122 (*)    Calcium 8.8 (*)    AST 49 (*)    ALT 75 (*)    All other components within normal limits  TROPONIN I    ____________________________________________    EKG I, Lisa Roca, MD, the attending physician have personally viewed and interpreted all ECGs.  88 bpm. Normal sinus rhythm with occasional PVC. Narrow QRS. Normal axis. Nonspecific T-wave with biphasic T waves in V5 and V6. Compared with prior EKG, slightly  more pronounced lateral T-wave nonspecific findings than previous. ____________________________________________  RADIOLOGY All Xrays were viewed by me.  Imaging interpreted by Radiologist, and I, Lisa Roca, MD the attending physician have reviewed the radiologist interpretation noted below.  Chest xray:  IMPRESSION: Mild atelectatic opacity at the right base. __________________________________________  PROCEDURES  Procedure(s) performed: None  Critical Care performed: None  ____________________________________________  No current facility-administered medications on file prior to encounter.    Current Outpatient Prescriptions on File Prior to Encounter  Medication Sig Dispense Refill  . Flaxseed, Linseed, (FLAX SEED OIL PO) Take by mouth.    Marland Kitchen glucosamine-chondroitin 500-400 MG tablet Take 1 tablet by mouth 3 (three) times daily.    Marland Kitchen omeprazole (PRILOSEC) 10 MG capsule Take 1 capsule (10 mg total) by mouth daily. 90 capsule 3    ____________________________________________  ED COURSE / ASSESSMENT AND PLAN  Pertinent labs & imaging results that were available during my care of the patient were reviewed by me and considered in my medical decision making (see chart for details).    Mr. Justo symptoms seem musculoskeletal as it is worse when he moves his trunk or his walking. There is not really a pleuritic or pulmonary component to this. The patient states he had a very stressful day where he was doing a lot of lifting and exertion 2 days ago, did not report pain or discomfort or known overuse or trauma at the time.  x-ray without significant findings related to his symptoms. Symptoms do not seem related to pulmonary embolism. I have low suspicion for ACS, but I am going to recommend he follow-up with a primary care doctor.  His blood pressure is high here and states that he was told he had high blood pressure but they have not started on medications yet. We talked about  putting him on a low-dose how to chlorothiazide and he would like to go ahead and start this.  In terms of the chest discomfort, it sure seems musculoskeletal. Laboratory studies and clinical evaluation and exam seem unlikely for emergency cause. I am going to treat him for presumed musculoskeletal source of discomfort. We did discuss return precautions.  DIFFERENTIAL DIAGNOSIS: Differential diagnosis includes, but is not limited to, ACS, aortic dissection, pulmonary embolism, cardiac tamponade, pneumothorax, pneumonia, pericarditis/myocarditis, GI-related causes including esophagitis/gastritis, and musculoskeletal chest wall pain.    CONSULTATIONS:  None  Patient / Family / Caregiver informed of clinical course, medical decision-making process, and agree with plan.   I discussed return precautions, follow-up instructions, and discharge instructions with patient and/or family.  Discharge Instructions : You were evaluated for left-sided chest pain, and as we discussed although no certain cause was found, your exam and evaluation are overall reassuring in the emergency department today.  Return to the emergency department immediately for any worsening or uncontrolled chest pain, trouble breathing, shortness of breath, vomiting or coughing up blood, fever, abdominal pain, numbness, weakness, dizziness, passing out, or any other symptoms concerning to you.  You are started on a low-dose blood pressure medicine for elevated blood pressures. Please follow-up with your primary care doctor for reevaluation and adjustments.  Please call the cardiology's office for an appointment on Monday or Tuesday.  ___________________________________________   FINAL CLINICAL IMPRESSION(S) / ED DIAGNOSES   Final diagnoses:  Nonspecific chest pain  Hypertension, unspecified type              Note: This dictation was prepared with Dragon dictation. Any transcriptional errors that result from this  process are unintentional    Lisa Roca, MD 10/10/16 1000

## 2016-10-10 NOTE — ED Triage Notes (Signed)
Chest pain, esp with movement and or inspiration, since last night.

## 2016-10-13 ENCOUNTER — Encounter: Payer: Self-pay | Admitting: *Deleted

## 2016-10-13 ENCOUNTER — Encounter: Payer: Self-pay | Admitting: Cardiovascular Disease

## 2016-10-13 ENCOUNTER — Ambulatory Visit (INDEPENDENT_AMBULATORY_CARE_PROVIDER_SITE_OTHER): Payer: BLUE CROSS/BLUE SHIELD | Admitting: Cardiovascular Disease

## 2016-10-13 VITALS — BP 114/82 | HR 96 | Ht 71.0 in | Wt 176.5 lb

## 2016-10-13 DIAGNOSIS — Z87891 Personal history of nicotine dependence: Secondary | ICD-10-CM | POA: Diagnosis not present

## 2016-10-13 DIAGNOSIS — R0789 Other chest pain: Secondary | ICD-10-CM | POA: Diagnosis not present

## 2016-10-13 DIAGNOSIS — R030 Elevated blood-pressure reading, without diagnosis of hypertension: Secondary | ICD-10-CM | POA: Diagnosis not present

## 2016-10-13 DIAGNOSIS — R9431 Abnormal electrocardiogram [ECG] [EKG]: Secondary | ICD-10-CM | POA: Diagnosis not present

## 2016-10-13 NOTE — Progress Notes (Signed)
Cardiology Office Note  Date:  10/13/2016   ID:  [name], DOB Oct 24, 1960, MRN [id number]  PCP:  Ria Bush, MD   Chief Complaint  Patient presents with  . other    F/u ED due to chest pain c/o left rib cage pain feels like it's worse due to going to work. Pt refused EKG unless necessary. Meds reviewed verbally with pt.    HPI:  Bradley Gonzales. is a 56 y.o. male  With no prior cardiac history Who presents by her for all from the emergency room for left Chest/rib pain  He reports that last Friday he was lifting heavy boxes He does not have a 2 wheeler and has to lift everything He works in shipping and receiving That evening was laying on the couch and when he woke up had severe left lank/rib pain  Pain was severe, hurt to move his upper body, hurt to touch, hurts to breathe Martin Majestic to the emergency room Hospital records reviewed with the patient in detail Cardiac enzymes negative He was noted to have one EKG with nonspecific T wave abnormality V5 V6 And was referred to our office  He is given Flexeril, HCTZ 12.5 mg daily for high blood pressure, ibuprofen 800 mg every 6 Medication has helped somewhat but he went back to work and had severe pain He was not given a work note from the emergency room  Reports having some point sensitivity/pain on palpation of his left ribs third from the bottom on the left Hurts to lay on his left side has to lay on his right side  hurts to move his upper body  No EKG performed today but we did review EKG from the emergency room as above Normal sinus rhythm with nonspecific T wave abnormality V5 and V6     PMH:   has a past medical history of GERD (gastroesophageal reflux disease).  PSH:    Past Surgical History:  Procedure Laterality Date  . CHOLECYSTECTOMY  2011    Current Outpatient Prescriptions  Medication Sig Dispense Refill  . cyclobenzaprine (FLEXERIL) 10 MG tablet Take 1 tablet (10 mg total) by mouth 3  (three) times daily as needed for muscle spasms. 21 tablet 0  . Flaxseed, Linseed, (FLAX SEED OIL PO) Take by mouth.    Marland Kitchen glucosamine-chondroitin 500-400 MG tablet Take 1 tablet by mouth 3 (three) times daily.    . hydrochlorothiazide (HYDRODIURIL) 12.5 MG tablet Take 1 tablet (12.5 mg total) by mouth daily. 30 tablet 0  . ibuprofen (ADVIL,MOTRIN) 800 MG tablet Take 1 tablet (800 mg total) by mouth every 8 (eight) hours as needed. 30 tablet 0  . omeprazole (PRILOSEC) 10 MG capsule Take 1 capsule (10 mg total) by mouth daily. 90 capsule 3   No current facility-administered medications for this visit.      Allergies:   Patient has no known allergies.   Social History:  The patient  reports that he quit smoking about 22 years ago. He has never used smokeless tobacco. He reports that he drinks alcohol. He reports that he does not use drugs.   Family History:   family history includes Cancer in his mother and paternal grandfather; Diabetes in his paternal uncle; Heart attack in his paternal grandfather; Heart failure in his maternal grandfather; Hypertension in his mother.    Review of Systems: Review of Systems  Constitutional: Negative.   Respiratory: Negative.   Cardiovascular: Negative.        Left  chest wall pain  Gastrointestinal: Negative.   Musculoskeletal: Negative.   Neurological: Negative.   Psychiatric/Behavioral: Negative.   All other systems reviewed and are negative.    PHYSICAL EXAM: VS:  BP 114/82 (BP Location: Right Arm, Patient Position: Sitting, Cuff Size: Normal)   Pulse 96   Ht 5\' 11"  (1.803 m)   Wt 176 lb 8 oz (80.1 kg)   BMI 24.62 kg/m  , BMI Body mass index is 24.62 kg/m. GEN: Well nourished, well developed, in no acute distress  HEENT: normal  Neck: no JVD, carotid bruits, or masses Cardiac: RRR; no murmurs, rubs, or gallops,no edema  Respiratory:  clear to auscultation bilaterally, normal work of breathing GI: soft, nontender, nondistended, + BS MS:  no deformity or atrophy  Skin: warm and dry, no rash Neuro:  Strength and sensation are intact Psych: euthymic mood, full affect    Recent Labs: 10/10/2016: ALT 75; BUN 13; Creatinine, Ser 0.81; Hemoglobin 14.1; Platelets 355; Potassium 3.6; Sodium 141    Lipid Panel Lab Results  Component Value Date   CHOL 159 09/06/2015   HDL 41 09/06/2015   LDLCALC 66 09/06/2015   TRIG 262 (H) 09/06/2015      Wt Readings from Last 3 Encounters:  10/13/16 176 lb 8 oz (80.1 kg)  10/10/16 177 lb (80.3 kg)  09/09/15 178 lb 4 oz (80.9 kg)       ASSESSMENT AND PLAN:  Abnormal EKG Nonspecific T wave abnormality V5 and V6 Possibly secondary to lead placement or transient changes in the setting of stress Unable to definitively exclude ischemia Denies any recent symptoms of angina He does not want a EKG today, prefers to follow-up with primary care physician for repeat If repeat EKG shows resolution of changes, no further testing needed If he continues to have nonspecific T-wave abnormality could consider CT coronary calcium scoring for risk stratification  Chest wall pain Musculoskeletal pain likely from lifting heavy boxes at work Suggested he recommend to the company they buy a 2 wheeler to help with heavy packages They do not have one recommended he continue naproxen, Flexeril, icing, rest Needs to be out of work for several days, most of this week would be the best thing  History of smoking Stopped smoking 20 years ago  Transient elevated blood pressure Started on HCTZ by primary care in the setting of pain Blood pressure borderline low today Recommended he stop the HCTZ and check his blood pressure on weekends Talk with primary care if he needs to restart the medication  Disposition:   F/U  12 months   Total encounter time more than 45 minutes  Greater than 50% was spent in counseling and coordination of care with the patient   No orders of the defined types were placed in  this encounter.    Signed, Esmond Plants, M.D., Ph.D. 10/13/2016  [place], [place]

## 2016-10-13 NOTE — Patient Instructions (Signed)
Medication Instructions:   ICE Aleve 2 in am and 2 in PM Flexeril as needed  Hold the HCTZ, monitor BP  Labwork:  No new labs needed  Testing/Procedures:  No further testing at this time   Follow-Up: It was a pleasure seeing you in the office today. Please call us if you have new issues that need to be addressed before your next appt.  [phone]  Your physician wants you to follow-up in: As needed  If you need a refill on your cardiac medications before your next appointment, please call your pharmacy.

## 2016-10-25 ENCOUNTER — Encounter: Payer: Self-pay | Admitting: Family Medicine

## 2016-10-25 DIAGNOSIS — R9431 Abnormal electrocardiogram [ECG] [EKG]: Secondary | ICD-10-CM | POA: Insufficient documentation

## 2017-12-22 DIAGNOSIS — J209 Acute bronchitis, unspecified: Secondary | ICD-10-CM | POA: Diagnosis not present

## 2018-08-09 DIAGNOSIS — F4323 Adjustment disorder with mixed anxiety and depressed mood: Secondary | ICD-10-CM | POA: Diagnosis not present

## 2018-08-12 DIAGNOSIS — F4323 Adjustment disorder with mixed anxiety and depressed mood: Secondary | ICD-10-CM | POA: Diagnosis not present

## 2018-08-15 ENCOUNTER — Ambulatory Visit (INDEPENDENT_AMBULATORY_CARE_PROVIDER_SITE_OTHER): Payer: BC Managed Care – PPO | Admitting: Family Medicine

## 2018-08-15 ENCOUNTER — Encounter: Payer: Self-pay | Admitting: Family Medicine

## 2018-08-15 ENCOUNTER — Other Ambulatory Visit: Payer: Self-pay

## 2018-08-15 VITALS — BP 140/100 | HR 86 | Temp 98.8°F | Ht 70.5 in | Wt 176.6 lb

## 2018-08-15 DIAGNOSIS — F1021 Alcohol dependence, in remission: Secondary | ICD-10-CM | POA: Insufficient documentation

## 2018-08-15 DIAGNOSIS — R030 Elevated blood-pressure reading, without diagnosis of hypertension: Secondary | ICD-10-CM | POA: Diagnosis not present

## 2018-08-15 DIAGNOSIS — F418 Other specified anxiety disorders: Secondary | ICD-10-CM

## 2018-08-15 DIAGNOSIS — F10230 Alcohol dependence with withdrawal, uncomplicated: Secondary | ICD-10-CM

## 2018-08-15 DIAGNOSIS — F1020 Alcohol dependence, uncomplicated: Secondary | ICD-10-CM | POA: Insufficient documentation

## 2018-08-15 MED ORDER — SERTRALINE HCL 50 MG PO TABS: 50.0000 mg | ORAL_TABLET | Freq: Every day | ORAL | 3 refills | Status: DC

## 2018-08-15 NOTE — Assessment & Plan Note (Signed)
Again elevated readings noted, in setting of quitting alcohol. Will reassess once off alcohol.

## 2018-08-15 NOTE — Assessment & Plan Note (Signed)
High scores in PHQ9 and GAD7 - in setting of substance use which can obfuscate picture. rec completely stop alcohol and will also start sertraline 50mg  daily. He endorses good social support at home and will continue seeing his counselor, pastor, and sponsor. RTC 1 mo f/u visit.

## 2018-08-15 NOTE — Progress Notes (Signed)
This visit was conducted in person.  BP (!) 140/100 (BP Location: Right Arm, Patient Position: Sitting, Cuff Size: Normal)   Pulse 86   Temp 98.8 F (37.1 C) (Temporal)   Ht 5' 10.5" (1.791 m)   Wt 176 lb 9 oz (80.1 kg)   SpO2 98%   BMI 24.98 kg/m   On repeat, 140/110 CC: discuss depression, alcohol abuse Subjective:    Patient ID: Bradley Gonzales., male    DOB: 1960/04/21, 58 y.o.   MRN: [id number]  HPI: Bradley Gonzales. is a 58 y.o. male presenting on 08/15/2018 for Depression (Here for f/u.)   Last seen here 09/2015.   Endorses significant drinking over last several years. At least 4 shots/day for the last 3-4 months - but up to a pint at a time in the past. Marital stressors due to drinking.  He has started seeing counselor at work weekly who says he's having significant anxiety which he's medicating with alcohol intake. Feels counselor is really helping him.  Good social support - pastor (meets weekly) and sponsor. Looking into AA online.   No fmhx alcoholism but father did drink. Will check with mom about this.  Has not had a drink in 2-3 days.   Some SI a few weeks ago - called counselor and was able to work through these thoughts. No plan. No further SI/HI.      Relevant past medical, surgical, family and social history reviewed and updated as indicated. Interim medical history since our last visit reviewed. Allergies and medications reviewed and updated. Outpatient Medications Prior to Visit  Medication Sig Dispense Refill  . Multiple Vitamins-Minerals (SUPER MEGA VITE 75/BETA CARO PO) Take by mouth daily.    Marland Kitchen omeprazole (PRILOSEC) 20 MG capsule Take 1 capsule (20 mg total) by mouth daily.    Marland Kitchen omeprazole (PRILOSEC) 10 MG capsule Take 1 capsule (10 mg total) by mouth daily. 90 capsule 3  . cyclobenzaprine (FLEXERIL) 10 MG tablet Take 1 tablet (10 mg total) by mouth 3 (three) times daily as needed for muscle spasms. 21 tablet 0  . Flaxseed, Linseed, (FLAX SEED  OIL PO) Take by mouth.    Marland Kitchen glucosamine-chondroitin 500-400 MG tablet Take 1 tablet by mouth 3 (three) times daily.    . hydrochlorothiazide (HYDRODIURIL) 12.5 MG tablet Take 1 tablet (12.5 mg total) by mouth daily. 30 tablet 0  . ibuprofen (ADVIL,MOTRIN) 800 MG tablet Take 1 tablet (800 mg total) by mouth every 8 (eight) hours as needed. 30 tablet 0   No facility-administered medications prior to visit.      Per HPI unless specifically indicated in ROS section below Review of Systems Objective:    BP (!) 140/100 (BP Location: Right Arm, Patient Position: Sitting, Cuff Size: Normal)   Pulse 86   Temp 98.8 F (37.1 C) (Temporal)   Ht 5' 10.5" (1.791 m)   Wt 176 lb 9 oz (80.1 kg)   SpO2 98%   BMI 24.98 kg/m   Wt Readings from Last 3 Encounters:  08/15/18 176 lb 9 oz (80.1 kg)  10/13/16 176 lb 8 oz (80.1 kg)  10/10/16 177 lb (80.3 kg)    Physical Exam Vitals signs and nursing note reviewed.  Constitutional:      General: He is not in acute distress.    Appearance: Normal appearance. He is not ill-appearing.  HENT:     Mouth/Throat:     Mouth: Mucous membranes are moist.     Pharynx:  No posterior oropharyngeal erythema.  Cardiovascular:     Rate and Rhythm: Normal rate and regular rhythm.     Pulses: Normal pulses.     Heart sounds: Normal heart sounds. No murmur.  Pulmonary:     Effort: Pulmonary effort is normal. No respiratory distress.     Breath sounds: Normal breath sounds. No wheezing, rhonchi or rales.  Abdominal:     General: Abdomen is flat. Bowel sounds are normal. There is no distension.     Palpations: Abdomen is soft. There is no mass.     Tenderness: There is no abdominal tenderness. There is no right CVA tenderness, left CVA tenderness, guarding or rebound.     Hernia: No hernia is present.  Musculoskeletal:     Right lower leg: No edema.     Left lower leg: No edema.  Neurological:     Mental Status: He is alert.  Psychiatric:        Mood and Affect:  Mood is anxious.       Depression screen PHQ 2/9 08/15/2018  Decreased Interest 2  Down, Depressed, Hopeless 2  PHQ - 2 Score 4  Altered sleeping 2  Tired, decreased energy 0  Change in appetite 1  Feeling bad or failure about yourself  3  Trouble concentrating 3  Moving slowly or fidgety/restless 2  Suicidal thoughts 1  PHQ-9 Score 16    GAD 7 : Generalized Anxiety Score 08/15/2018  Nervous, Anxious, on Edge 3  Control/stop worrying 3  Worry too much - different things 3  Trouble relaxing 3  Restless 2  Easily annoyed or irritable 1  Afraid - awful might happen 3  Total GAD 7 Score 18     Office Visit from 08/15/2018 in [place] at [facility]  Alcohol Use Disorder Identification Test Final Score (AUDIT)  19     Assessment & Plan:  Reviewed he's overdue for annual physical - will schedule this.  Problem List Items Addressed This Visit    Elevated blood pressure reading    Again elevated readings noted, in setting of quitting alcohol. Will reassess once off alcohol.       Depression with anxiety    High scores in PHQ9 and GAD7 - in setting of substance use which can obfuscate picture. rec completely stop alcohol and will also start sertraline 50mg  daily. He endorses good social support at home and will continue seeing his counselor, pastor, and sponsor. RTC 1 mo f/u visit.       Relevant Medications   sertraline (ZOLOFT) 50 MG tablet   Alcohol dependence with withdrawal, uncomplicated (HCC) - Primary    Alcohol free for 2 days with endorsed sweating, hypertension, increased anxiety - anticipate exhibiting very mild withdrawal symptoms. Reviewed signs of withdrawal and discussed dangers of cutting alcohol cold [place] in extensive use history. Offered benzo treatment, reviewed other detox options as well as intensive outpatient alcohol abuse program - he prefers to self detox and continue treatment at home so I suggested slow alcohol taper plan over the next month. Red  flags to seek urgent care reviewed.  Reviewed pharmacotherapy options to quit alcohol, he declines for now. See below.  Reviewed AA - he will look into online AA program. Handout with further resources provided to patient.  RTC 1 mo f/u visit.           Meds ordered this encounter  Medications  . sertraline (ZOLOFT) 50 MG tablet    Sig: Take 1  tablet (50 mg total) by mouth daily.    Dispense:  30 tablet    Refill:  3   No orders of the defined types were placed in this encounter.   Follow up plan: Return in about 4 weeks (around 09/12/2018) for follow up visit.  Ria Bush, MD

## 2018-08-15 NOTE — Patient Instructions (Addendum)
Start sertraline 50mg  daily for mood.  Let us know if any questions or concerns. Return in 1 month for follow up visit and labs (see if you can schedule physical at that time). Start cutting down on drinking - 2 shots a day for 2 weeks then decrease to 1 shot a day for 2 weeks then stop - let us know if any trouble with this.  Alcohol Abuse and Dependence Information, Adult Alcohol is a widely available drug. People drink alcohol in different amounts. People who drink alcohol very often and in large amounts often have problems during and after drinking. They may develop what is called an alcohol use disorder. There are two main types of alcohol use disorders:  Alcohol abuse. This is when you use alcohol too much or too often. You may use alcohol to make yourself feel happy or to reduce stress. You may have a hard time setting a limit on the amount you drink.  Alcohol dependence. This is when you use alcohol consistently for a period of time, and your body changes as a result. This can make it hard to stop drinking because you may start to feel sick or feel different when you do not use alcohol. These symptoms are known as withdrawal. How can alcohol abuse and dependence affect me? Alcohol abuse and dependence can have a negative effect on your life. Drinking too much can lead to addiction. You may feel like you need alcohol to function normally. You may drink alcohol before work in the morning, during the day, or as soon as you get home from work in the evening. These actions can result in:  Poor work performance.  Job loss.  Financial problems.  Car crashes or criminal charges from driving after drinking alcohol.  Problems in your relationships with friends and family.  Losing the trust and respect of coworkers, friends, and family. Drinking heavily over a long period of time can permanently damage your body and brain, and can cause lifelong health issues, such as:  Damage to your liver  or pancreas.  Heart problems, high blood pressure, or stroke.  Certain cancers.  Decreased ability to fight infections.  Brain or nerve damage.  Depression.  Early (premature) death. If you are careless or you crave alcohol, it is easy to drink more than your body can handle (overdose). Alcohol overdose is a serious situation that requires hospitalization. It may lead to permanent injuries or death. What can increase my risk?  Having a family history of alcohol abuse.  Having depression or other mental health conditions.  Beginning to drink at an early age.  Binge drinking often.  Experiencing trauma, stress, and an unstable home life during childhood.  Spending time with people who drink often. What actions can I take to prevent or manage alcohol abuse and dependence?  Do not drink alcohol if: ? Your health care provider tells you not to drink. ? You are pregnant, may be pregnant, or are planning to become pregnant.  If you drink alcohol: ? Limit how much you use to:  0-1 drink a day for women.  0-2 drinks a day for men. ? Be aware of how much alcohol is in your drink. In the [place] one drink equals one 12 oz bottle of beer (355 mL), one 5 oz glass of wine (148 mL), or one 1 oz glass of hard liquor (44 mL).  Stop drinking if you have been drinking too much. This can be very hard to do if  you are used to abusing alcohol. If you begin to have withdrawal symptoms, talk with your health care provider or a person that you trust. These symptoms may include anxiety, shaky hands, headache, nausea, sweating, or not being able to sleep.  Choose to drink nonalcoholic beverages in social gatherings and places where there may be alcohol. Activity  Spend more time on activities that you enjoy that do not involve alcohol, like hobbies or exercise.  Find healthy ways to cope with stress, such as exercise, meditation, or spending time with people you care about. General information   Talk to your family, coworkers, and friends about supporting you in your efforts to stop drinking. If they drink, ask them not to drink around you. Spend more time with people who do not drink alcohol.  If you think that you have an alcohol dependency problem: ? Tell friends or family about your concerns. ? Talk with your health care provider or another health professional about where to get help. ? Work with a Transport planner and a Regulatory affairs officer. ? Consider joining a support group for people who struggle with alcohol abuse and dependence. Where to find support   Your health care provider.  SMART Recovery: www.smartrecovery.org Therapy and support groups  Local treatment centers or chemical dependency counselors.  Local AA groups in your community: NicTax.com.pt Where to find more information  Centers for Disease Control and Prevention: http://www.wolf.info/  National Institute on Alcohol Abuse and Alcoholism: http://www.bradshaw.com/  Alcoholics Anonymous (AA): NicTax.com.pt Contact a health care provider if:  You drank more or for longer than you intended on more than one occasion.  You tried to stop drinking or to cut back on how much you drink, but you were not able to.  You often drink to the point of vomiting or passing out.  You want to drink so badly that you cannot think about anything else.  You have problems in your life due to drinking, but you continue to drink.  You keep drinking even though you feel anxious, depressed, or have experienced memory loss.  You have stopped doing the things you used to enjoy in order to drink.  You have to drink more than you used to in order to get the effect you want.  You experience anxiety, sweating, nausea, shakiness, and trouble sleeping when you try to stop drinking. Get help right away if:  You have thoughts about hurting yourself or others.  You have serious withdrawal symptoms, including: ? Confusion. ? Racing heart. ?  High blood pressure. ? Fever. If you ever feel like you may hurt yourself or others, or have thoughts about taking your own life, get help right away. You can go to your nearest emergency department or call:  Your local emergency services (911 in the [place]  A suicide crisis helpline, such as the [facility] at 9365798582. This is open 24 hours a day. Summary  Alcohol abuse and dependence can have a negative effect on your life. Drinking too much or too often can lead to addiction.  If you drink alcohol, limit how much you use.  If you are having trouble keeping your drinking under control, find ways to change your behavior. Hobbies, calming activities, exercise, or support groups can help.  If you feel you need help with changing your drinking habits, talk with your health care provider, a good friend, or a therapist, or go to an [place] group. This information is not intended to replace advice given to  you by your health care provider. Make sure you discuss any questions you have with your health care provider. Document Released: 12/24/2015 Document Revised: 04/19/2018 Document Reviewed: 03/08/2018 Elsevier Patient Education  2020 [employer].

## 2018-08-15 NOTE — Assessment & Plan Note (Addendum)
Alcohol free for 2 days with endorsed sweating, hypertension, increased anxiety - anticipate exhibiting very mild withdrawal symptoms. Reviewed signs of withdrawal and discussed dangers of cutting alcohol cold [place] in extensive use history. Offered benzo treatment, reviewed other detox options as well as intensive outpatient alcohol abuse program - he prefers to self detox and continue treatment at home so I suggested slow alcohol taper plan over the next month. Red flags to seek urgent care reviewed.  Reviewed pharmacotherapy options to quit alcohol, he declines for now. See below.  Reviewed AA - he will look into online AA program. Handout with further resources provided to patient.  RTC 1 mo f/u visit.

## 2018-08-19 DIAGNOSIS — F4323 Adjustment disorder with mixed anxiety and depressed mood: Secondary | ICD-10-CM | POA: Diagnosis not present

## 2018-09-02 DIAGNOSIS — F4323 Adjustment disorder with mixed anxiety and depressed mood: Secondary | ICD-10-CM | POA: Diagnosis not present

## 2018-09-09 DIAGNOSIS — F4323 Adjustment disorder with mixed anxiety and depressed mood: Secondary | ICD-10-CM | POA: Diagnosis not present

## 2018-09-12 ENCOUNTER — Other Ambulatory Visit: Payer: Self-pay | Admitting: Family Medicine

## 2018-09-12 DIAGNOSIS — F10230 Alcohol dependence with withdrawal, uncomplicated: Secondary | ICD-10-CM

## 2018-09-12 DIAGNOSIS — R7401 Elevation of levels of liver transaminase levels: Secondary | ICD-10-CM

## 2018-09-12 DIAGNOSIS — N400 Enlarged prostate without lower urinary tract symptoms: Secondary | ICD-10-CM

## 2018-09-12 DIAGNOSIS — E785 Hyperlipidemia, unspecified: Secondary | ICD-10-CM

## 2018-09-13 ENCOUNTER — Other Ambulatory Visit (INDEPENDENT_AMBULATORY_CARE_PROVIDER_SITE_OTHER): Payer: BC Managed Care – PPO

## 2018-09-13 ENCOUNTER — Other Ambulatory Visit: Payer: Self-pay

## 2018-09-13 DIAGNOSIS — R7401 Elevation of levels of liver transaminase levels: Secondary | ICD-10-CM

## 2018-09-13 DIAGNOSIS — N400 Enlarged prostate without lower urinary tract symptoms: Secondary | ICD-10-CM | POA: Diagnosis not present

## 2018-09-13 DIAGNOSIS — E785 Hyperlipidemia, unspecified: Secondary | ICD-10-CM

## 2018-09-13 DIAGNOSIS — R740 Nonspecific elevation of levels of transaminase and lactic acid dehydrogenase [LDH]: Secondary | ICD-10-CM | POA: Diagnosis not present

## 2018-09-13 DIAGNOSIS — F10230 Alcohol dependence with withdrawal, uncomplicated: Secondary | ICD-10-CM | POA: Diagnosis not present

## 2018-09-13 LAB — VITAMIN B12: Vitamin B-12: 243 pg/mL (ref 211–911)

## 2018-09-13 LAB — COMPREHENSIVE METABOLIC PANEL
ALT: 54 U/L — ABNORMAL HIGH (ref 0–53)
AST: 47 U/L — ABNORMAL HIGH (ref 0–37)
Albumin: 4.2 g/dL (ref 3.5–5.2)
Alkaline Phosphatase: 81 U/L (ref 39–117)
BUN: 15 mg/dL (ref 6–23)
CO2: 27 mEq/L (ref 19–32)
Calcium: 9.1 mg/dL (ref 8.4–10.5)
Chloride: 103 mEq/L (ref 96–112)
Creatinine, Ser: 0.84 mg/dL (ref 0.40–1.50)
GFR: 94.51 mL/min (ref >60.00)
Glucose, Bld: 99 mg/dL (ref 70–99)
Potassium: 4.1 mEq/L (ref 3.5–5.1)
Sodium: 139 mEq/L (ref 135–145)
Total Bilirubin: 1 mg/dL (ref 0.2–1.2)
Total Protein: 7.2 g/dL (ref 6.0–8.3)

## 2018-09-13 LAB — CBC WITH DIFFERENTIAL/PLATELET
Basophils Absolute: 0 10*3/uL (ref 0.0–0.1)
Basophils Relative: 0.4 % (ref 0.0–3.0)
Eosinophils Absolute: 0.1 10*3/uL (ref 0.0–0.7)
Eosinophils Relative: 1.6 % (ref 0.0–5.0)
HCT: 44.6 % (ref 39.0–52.0)
Hemoglobin: 14.8 g/dL (ref 13.0–17.0)
Lymphocytes Relative: 25.3 % (ref 12.0–46.0)
Lymphs Abs: 2 10*3/uL (ref 0.7–4.0)
MCHC: 33.1 g/dL (ref 30.0–36.0)
MCV: 91.4 fl (ref 78.0–100.0)
Monocytes Absolute: 1.2 10*3/uL — ABNORMAL HIGH (ref 0.1–1.0)
Monocytes Relative: 14.9 % — ABNORMAL HIGH (ref 3.0–12.0)
Neutro Abs: 4.5 10*3/uL (ref 1.4–7.7)
Neutrophils Relative %: 57.8 % (ref 43.0–77.0)
Platelets: 314 10*3/uL (ref 150.0–400.0)
RBC: 4.88 Mil/uL (ref 4.22–5.81)
RDW: 19 % — ABNORMAL HIGH (ref 11.5–15.5)
WBC: 7.7 10*3/uL (ref 4.0–10.5)

## 2018-09-13 LAB — LIPID PANEL
Cholesterol: 193 mg/dL (ref 0–200)
HDL: 49.9 mg/dL (ref >39.00)
LDL Cholesterol: 116 mg/dL — ABNORMAL HIGH (ref 0–99)
NonHDL: 143.52
Total CHOL/HDL Ratio: 4
Triglycerides: 140 mg/dL (ref 0.0–149.0)
VLDL: 28 mg/dL (ref 0.0–40.0)

## 2018-09-13 LAB — FOLATE: Folate: 15.3 ng/mL (ref >5.9)

## 2018-09-13 LAB — PSA: PSA: 0.55 ng/mL (ref 0.10–4.00)

## 2018-09-14 DIAGNOSIS — F4323 Adjustment disorder with mixed anxiety and depressed mood: Secondary | ICD-10-CM | POA: Diagnosis not present

## 2018-09-16 ENCOUNTER — Encounter: Payer: Self-pay | Admitting: Family Medicine

## 2018-09-16 ENCOUNTER — Other Ambulatory Visit: Payer: Self-pay

## 2018-09-16 ENCOUNTER — Ambulatory Visit (INDEPENDENT_AMBULATORY_CARE_PROVIDER_SITE_OTHER): Payer: BC Managed Care – PPO | Admitting: Family Medicine

## 2018-09-16 VITALS — BP 138/80 | HR 97 | Temp 98.3°F | Ht 70.0 in | Wt 171.2 lb

## 2018-09-16 DIAGNOSIS — R7401 Elevation of levels of liver transaminase levels: Secondary | ICD-10-CM

## 2018-09-16 DIAGNOSIS — G4700 Insomnia, unspecified: Secondary | ICD-10-CM | POA: Diagnosis not present

## 2018-09-16 DIAGNOSIS — F418 Other specified anxiety disorders: Secondary | ICD-10-CM

## 2018-09-16 DIAGNOSIS — F1021 Alcohol dependence, in remission: Secondary | ICD-10-CM

## 2018-09-16 DIAGNOSIS — Z0001 Encounter for general adult medical examination with abnormal findings: Secondary | ICD-10-CM | POA: Diagnosis not present

## 2018-09-16 DIAGNOSIS — E785 Hyperlipidemia, unspecified: Secondary | ICD-10-CM

## 2018-09-16 DIAGNOSIS — Z0000 Encounter for general adult medical examination without abnormal findings: Secondary | ICD-10-CM

## 2018-09-16 DIAGNOSIS — E538 Deficiency of other specified B group vitamins: Secondary | ICD-10-CM

## 2018-09-16 DIAGNOSIS — K219 Gastro-esophageal reflux disease without esophagitis: Secondary | ICD-10-CM

## 2018-09-16 DIAGNOSIS — R030 Elevated blood-pressure reading, without diagnosis of hypertension: Secondary | ICD-10-CM

## 2018-09-16 DIAGNOSIS — R740 Nonspecific elevation of levels of transaminase and lactic acid dehydrogenase [LDH]: Secondary | ICD-10-CM

## 2018-09-16 MED ORDER — TRAZODONE HCL 50 MG PO TABS: 25.0000 mg | ORAL_TABLET | Freq: Every evening | ORAL | 3 refills | Status: DC | PRN

## 2018-09-16 MED ORDER — B-12 1000 MCG SL SUBL: 1.0000 | SUBLINGUAL_TABLET | Freq: Every day | SUBLINGUAL | Status: DC

## 2018-09-16 MED ORDER — SERTRALINE HCL 25 MG PO TABS: 25.0000 mg | ORAL_TABLET | Freq: Every day | ORAL | 11 refills | Status: DC

## 2018-09-16 NOTE — Progress Notes (Signed)
This visit was conducted in person.  BP 138/80 (BP Location: Left Arm, Patient Position: Sitting, Cuff Size: Normal)   Pulse 97   Temp 98.3 F (36.8 C) (Temporal)   Ht _0  (1.778 m)   Wt 171 lb 3 oz (77.7 kg)   SpO2 96%   BMI 24.56 kg/m    CC: CPE Subjective:    Patient ID: Bradley Amel., male    DOB: 09-12-1960, 58 y.o.   MRN: [id number]  HPI: Bradley Soeder. is a 58 y.o. male presenting on 09/16/2018 for Annual Exam   See prior note for details. Seen here last month endorsing depression with anxiety and alcohol use disorder - we reviewed slow taper of alcohol over the next several weeks as he declined benzo detox. We also discussed going to local AA program. We also started sertraline 6m daily for mood - finds this significantly helps him focus during the day. Limiting alcohol to 1 beer in evenings. Notices needs something to help him relax at night - sleep maintenance insomnia - wakes up after 3-4 hours of sleep. Tried neighbor's melatonin but worsened jittering in legs.   Preventative: Colon cancer - discussed, would like stool kit.  Prostate cancer screening - DRE with enlarged prostate 09/2012. Requests deferred today. PSA reassuring. Some nocturia.  Flu shot - declines  Tetanus 2011  Seat belt discussed.  Sunscreen use discussed, no changing moles on skin.  Non smoker.  Alcohol use - down to 1 beer a night Dentist q6 mo Eye exam yearly  Caffeine: coke, dr pepper, tea  Lives with wife (Bradley Gonzales and 1 son (Bradley Gonzales, 1 dog  Occupation: Receiving clerk  Edu: HS  Activity: fishes, walking dog daily 20 min  Diet: good water intake, some fruits/vegetables      Relevant past medical, surgical, family and social history reviewed and updated as indicated. Interim medical history since our last visit reviewed. Allergies and medications reviewed and updated. Outpatient Medications Prior to Visit  Medication Sig Dispense Refill  . omeprazole (PRILOSEC) 20 MG  capsule Take 1 capsule (20 mg total) by mouth daily.    . Multiple Vitamins-Minerals (SUPER MEGA VITE 75/BETA CARO PO) Take by mouth daily.    . sertraline (ZOLOFT) 50 MG tablet Take 1 tablet (50 mg total) by mouth daily. 30 tablet 3   No facility-administered medications prior to visit.      Per HPI unless specifically indicated in ROS section below Review of Systems  Constitutional: Positive for appetite change (decreased - attributed to sertraline). Negative for activity change, chills, fatigue, fever and unexpected weight change.  HENT: Negative for hearing loss.   Eyes: Negative for visual disturbance.  Respiratory: Negative for cough, chest tightness, shortness of breath and wheezing.   Cardiovascular: Negative for chest pain, palpitations and leg swelling.  Gastrointestinal: Positive for diarrhea. Negative for abdominal distention, abdominal pain, blood in stool, constipation, nausea and vomiting.  Genitourinary: Negative for difficulty urinating and hematuria.  Musculoskeletal: Negative for arthralgias, myalgias and neck pain.  Skin: Negative for rash.  Neurological: Negative for dizziness, seizures, syncope and headaches.  Hematological: Negative for adenopathy. Does not bruise/bleed easily.  Psychiatric/Behavioral: Negative for dysphoric mood. The patient is nervous/anxious.    Objective:    BP 138/80 (BP Location: Left Arm, Patient Position: Sitting, Cuff Size: Normal)   Pulse 97   Temp 98.3 F (36.8 C) (Temporal)   Ht _1  (1.778 m)   Wt 171 lb 3 oz (77.7 kg)  SpO2 96%   BMI 24.56 kg/m   Wt Readings from Last 3 Encounters:  09/16/18 171 lb 3 oz (77.7 kg)  08/15/18 176 lb 9 oz (80.1 kg)  10/13/16 176 lb 8 oz (80.1 kg)    Physical Exam Vitals signs and nursing note reviewed.  Constitutional:      General: He is not in acute distress.    Appearance: Normal appearance. He is well-developed. He is not ill-appearing.  HENT:     Head: Normocephalic and atraumatic.      Right Ear: Hearing, tympanic membrane, ear canal and external ear normal.     Left Ear: Hearing, tympanic membrane, ear canal and external ear normal.     Nose: Nose normal.     Mouth/Throat:     Mouth: Mucous membranes are moist.     Pharynx: Uvula midline. No oropharyngeal exudate or posterior oropharyngeal erythema.  Eyes:     General: No scleral icterus.    Extraocular Movements: Extraocular movements intact.     Conjunctiva/sclera: Conjunctivae normal.     Pupils: Pupils are equal, round, and reactive to light.  Neck:     Musculoskeletal: Normal range of motion and neck supple.  Cardiovascular:     Rate and Rhythm: Normal rate and regular rhythm.     Pulses: Normal pulses.          Radial pulses are 2+ on the right side and 2+ on the left side.     Heart sounds: Normal heart sounds. No murmur.  Pulmonary:     Effort: Pulmonary effort is normal. No respiratory distress.     Breath sounds: Normal breath sounds. No wheezing, rhonchi or rales.  Abdominal:     General: Abdomen is flat. Bowel sounds are normal. There is no distension.     Palpations: Abdomen is soft. There is no mass.     Tenderness: There is no abdominal tenderness. There is no guarding or rebound.     Hernia: No hernia is present.  Musculoskeletal: Normal range of motion.  Lymphadenopathy:     Cervical: No cervical adenopathy.  Skin:    General: Skin is warm and dry.     Capillary Refill: Capillary refill takes less than 2 seconds.     Findings: No rash.  Neurological:     General: No focal deficit present.     Mental Status: He is alert and oriented to person, place, and time.     Comments: CN grossly intact, station and gait intact  Psychiatric:        Mood and Affect: Mood normal.        Behavior: Behavior normal.        Thought Content: Thought content normal.        Judgment: Judgment normal.       Results for orders placed or performed in visit on 09/13/18  Folate  Result Value Ref Range    Folate 15.3 >5.9 ng/mL  Vitamin B12  Result Value Ref Range   Vitamin B-12 243 211 - 911 pg/mL  CBC with Differential/Platelet  Result Value Ref Range   WBC 7.7 4.0 - 10.5 K/uL   RBC 4.88 4.22 - 5.81 Mil/uL   Hemoglobin 14.8 13.0 - 17.0 g/dL   HCT 44.6 39.0 - 52.0 %   MCV 91.4 78.0 - 100.0 fl   MCHC 33.1 30.0 - 36.0 g/dL   RDW 19.0 (H) 11.5 - 15.5 %   Platelets 314.0 150.0 - 400.0 K/uL   Neutrophils Relative %  57.8 43.0 - 77.0 %   Lymphocytes Relative 25.3 12.0 - 46.0 %   Monocytes Relative 14.9 (H) 3.0 - 12.0 %   Eosinophils Relative 1.6 0.0 - 5.0 %   Basophils Relative 0.4 0.0 - 3.0 %   Neutro Abs 4.5 1.4 - 7.7 K/uL   Lymphs Abs 2.0 0.7 - 4.0 K/uL   Monocytes Absolute 1.2 (H) 0.1 - 1.0 K/uL   Eosinophils Absolute 0.1 0.0 - 0.7 K/uL   Basophils Absolute 0.0 0.0 - 0.1 K/uL  PSA  Result Value Ref Range   PSA 0.55 0.10 - 4.00 ng/mL  Comprehensive metabolic panel  Result Value Ref Range   Sodium 139 135 - 145 mEq/L   Potassium 4.1 3.5 - 5.1 mEq/L   Chloride 103 96 - 112 mEq/L   CO2 27 19 - 32 mEq/L   Glucose, Bld 99 70 - 99 mg/dL   BUN 15 6 - 23 mg/dL   Creatinine, Ser 0.84 0.40 - 1.50 mg/dL   Total Bilirubin 1.0 0.2 - 1.2 mg/dL   Alkaline Phosphatase 81 39 - 117 U/L   AST 47 (H) 0 - 37 U/L   ALT 54 (H) 0 - 53 U/L   Total Protein 7.2 6.0 - 8.3 g/dL   Albumin 4.2 3.5 - 5.2 g/dL   Calcium 9.1 8.4 - 10.5 mg/dL   GFR 94.51 >60.00 mL/min  Lipid panel  Result Value Ref Range   Cholesterol 193 0 - 200 mg/dL   Triglycerides 140.0 0.0 - 149.0 mg/dL   HDL 49.90 >39.00 mg/dL   VLDL 28.0 0.0 - 40.0 mg/dL   LDL Cholesterol 116 (H) 0 - 99 mg/dL   Total CHOL/HDL Ratio 4    NonHDL 143.52    Depression screen [facility] 2/9 09/16/2018 08/15/2018  Decreased Interest 0 2  Down, Depressed, Hopeless 0 2  PHQ - 2 Score 0 4  Altered sleeping 2 2  Tired, decreased energy 0 0  Change in appetite 2 1  Feeling bad or failure about yourself  0 3  Trouble concentrating 1 3  Moving slowly or  fidgety/restless 2 2  Suicidal thoughts 0 1  PHQ-9 Score 7 16    GAD 7 : Generalized Anxiety Score 09/16/2018 08/15/2018  Nervous, Anxious, on Edge 2 3  Control/stop worrying 1 3  Worry too much - different things 1 3  Trouble relaxing 2 3  Restless 2 2  Easily annoyed or irritable 1 1  Afraid - awful might happen 1 3  Total GAD 7 Score 10 18   Assessment & Plan:   Problem List Items Addressed This Visit    Transaminitis    Ongoing, anticipate alcohol related. Will reassess off alcohol, consider updated liver US if persistent.       Low serum vitamin B12    rec start b12 1000 mcg daily.       Insomnia    Trial trazodone       HLD (hyperlipidemia)    Stable period off med.  The 10-year ASCVD risk score Mikey Bussing DC Brooke Bonito., et al., 2013) is: 6.7%   Values used to calculate the score:     Age: 48 years     Sex: Male     Is Non-Hispanic African American: No     Diabetic: No     Tobacco smoker: No     Systolic Blood Pressure: 051 mmHg     Is BP treated: No     HDL Cholesterol: 49.9 mg/dL  Total Cholesterol: 193 mg/dL       Healthcare maintenance - Primary    Preventative protocols reviewed and updated unless pt declined. Discussed healthy diet and lifestyle.       GERD (gastroesophageal reflux disease)    Stable on omeprazole 57m daily.       Elevated blood pressure reading    Improved readings today.       Depression with anxiety    Improved readings on sertraline 563mdaily - but having trouble tolerating as evidenced by loss of appetite. Will decrease sertraline to 2548maily. Will start trazodone 25-39m60mghtly to help with sleep maintenance insomnia.       Relevant Medications   sertraline (ZOLOFT) 25 MG tablet   traZODone (DESYREL) 50 MG tablet   Alcohol dependence (HCC)[place] Doing much better - down to 1 beer/day, no more liquor. Encouraged full cessation. He overall feels better. Motivated to remain off most alcohol.           Meds ordered this  encounter  Medications  . sertraline (ZOLOFT) 25 MG tablet    Sig: Take 1 tablet (25 mg total) by mouth daily.    Dispense:  30 tablet    Refill:  11  . traZODone (DESYREL) 50 MG tablet    Sig: Take 0.5-1 tablets (25-50 mg total) by mouth at bedtime as needed for sleep.    Dispense:  30 tablet    Refill:  3  . Cyanocobalamin (B-12) 1000 MCG SUBL    Sig: Place 1 tablet under the tongue daily.   No orders of the defined types were placed in this encounter.   Patient instructions: Pass by lab to pick up stool kit.  Decrease sertraline to 25mg20mly.  Try trazodone 1/2-1 tablet at night to help sleep.  Liver function remains a bit bumped - we will recheck next labs and if staying elevated, check liver ultrasound.  Return in 2-3 months for follow up visit.  Follow up plan: Return in about 3 months (around 12/16/2018) for follow up visit.  Bradley Gonzales

## 2018-09-16 NOTE — Assessment & Plan Note (Signed)
Stable on omeprazole 20mg  daily.

## 2018-09-16 NOTE — Assessment & Plan Note (Signed)
Preventative protocols reviewed and updated unless pt declined. Discussed healthy diet and lifestyle.  

## 2018-09-16 NOTE — Assessment & Plan Note (Signed)
Ongoing, anticipate alcohol related. Will reassess off alcohol, consider updated liver US if persistent.

## 2018-09-16 NOTE — Assessment & Plan Note (Signed)
Stable period off med.  The 10-year ASCVD risk score Mikey Bussing DC Brooke Bonito., et al., 2013) is: 6.7%   Values used to calculate the score:     Age: 58 years     Sex: Male     Is Non-Hispanic African American: No     Diabetic: No     Tobacco smoker: No     Systolic Blood Pressure: 0000000 mmHg     Is BP treated: No     HDL Cholesterol: 49.9 mg/dL     Total Cholesterol: 193 mg/dL

## 2018-09-16 NOTE — Assessment & Plan Note (Addendum)
rec start b12 1000 mcg daily.

## 2018-09-16 NOTE — Assessment & Plan Note (Signed)
Doing much better - down to 1 beer/day, no more liquor. Encouraged full cessation. He overall feels better. Motivated to remain off most alcohol.

## 2018-09-16 NOTE — Assessment & Plan Note (Signed)
Trial trazodone

## 2018-09-16 NOTE — Assessment & Plan Note (Signed)
Improved readings on sertraline 50mg  daily - but having trouble tolerating as evidenced by loss of appetite. Will decrease sertraline to 25mg  daily. Will start trazodone 25-50mg  nightly to help with sleep maintenance insomnia.

## 2018-09-16 NOTE — Patient Instructions (Addendum)
Pass by lab to pick up stool kit.  Decrease sertraline to 45m daily.  Try trazodone 1/2-1 tablet at night to help sleep.  Liver function remains a bit bumped - we will recheck next labs and if staying elevated, check liver ultrasound.  Return in 2-3 months for follow up visit.  Health Maintenance, Male Adopting a healthy lifestyle and getting preventive care are important in promoting health and wellness. Ask your health care provider about:  The right schedule for you to have regular tests and exams.  Things you can do on your own to prevent diseases and keep yourself healthy. What should I know about diet, weight, and exercise? Eat a healthy diet   Eat a diet that includes plenty of vegetables, fruits, low-fat dairy products, and lean protein.  Do not eat a lot of foods that are high in solid fats, added sugars, or sodium. Maintain a healthy weight Body mass index (BMI) is a measurement that can be used to identify possible weight problems. It estimates body fat based on height and weight. Your health care provider can help determine your BMI and help you achieve or maintain a healthy weight. Get regular exercise Get regular exercise. This is one of the most important things you can do for your health. Most adults should:  Exercise for at least 150 minutes each week. The exercise should increase your heart rate and make you sweat (moderate-intensity exercise).  Do strengthening exercises at least twice a week. This is in addition to the moderate-intensity exercise.  Spend less time sitting. Even light physical activity can be beneficial. Watch cholesterol and blood lipids Have your blood tested for lipids and cholesterol at 56years of age, then have this test every 5 years. You may need to have your cholesterol levels checked more often if:  Your lipid or cholesterol levels are high.  You are older than 56years of age.  You are at high risk for heart disease. What should I  know about cancer screening? Many types of cancers can be detected early and may often be prevented. Depending on your health history and family history, you may need to have cancer screening at various ages. This may include screening for:  Colorectal cancer.  Prostate cancer.  Skin cancer.  Lung cancer. What should I know about heart disease, diabetes, and high blood pressure? Blood pressure and heart disease  High blood pressure causes heart disease and increases the risk of stroke. This is more likely to develop in people who have high blood pressure readings, are of African descent, or are overweight.  Talk with your health care provider about your target blood pressure readings.  Have your blood pressure checked: ? Every 3-5 years if you are 18333years of age. ? Every year if you are 456years old or older.  If you are between the ages of 662and 731and are a current or former smoker, ask your health care provider if you should have a one-time screening for abdominal aortic aneurysm (AAA). Diabetes Have regular diabetes screenings. This checks your fasting blood sugar level. Have the screening done:  Once every three years after age 440if you are at a normal weight and have a low risk for diabetes.  More often and at a younger age if you are overweight or have a high risk for diabetes. What should I know about preventing infection? Hepatitis B If you have a higher risk for hepatitis B, you should be screened for  this virus. Talk with your health care provider to find out if you are at risk for hepatitis B infection. Hepatitis C Blood testing is recommended for:  Everyone born from [birth year] through 1963.  Anyone with known risk factors for hepatitis C. Sexually transmitted infections (STIs)  You should be screened each year for STIs, including gonorrhea and chlamydia, if: ? You are sexually active and are younger than 56 years of age. ? You are older than 56 years of age and  your health care provider tells you that you are at risk for this type of infection. ? Your sexual activity has changed since you were last screened, and you are at increased risk for chlamydia or gonorrhea. Ask your health care provider if you are at risk.  Ask your health care provider about whether you are at high risk for HIV. Your health care provider may recommend a prescription medicine to help prevent HIV infection. If you choose to take medicine to prevent HIV, you should first get tested for HIV. You should then be tested every 3 months for as long as you are taking the medicine. Follow these instructions at home: Lifestyle  Do not use any products that contain nicotine or tobacco, such as cigarettes, e-cigarettes, and chewing tobacco. If you need help quitting, ask your health care provider.  Do not use street drugs.  Do not share needles.  Ask your health care provider for help if you need support or information about quitting drugs. Alcohol use  Do not drink alcohol if your health care provider tells you not to drink.  If you drink alcohol: ? Limit how much you have to 0-2 drinks a day. ? Be aware of how much alcohol is in your drink. In the [place] one drink equals one 12 oz bottle of beer (355 mL), one 5 oz glass of wine (148 mL), or one 1 oz glass of hard liquor (44 mL). General instructions  Schedule regular health, dental, and eye exams.  Stay current with your vaccines.  Tell your health care provider if: ? You often feel depressed. ? You have ever been abused or do not feel safe at home. Summary  Adopting a healthy lifestyle and getting preventive care are important in promoting health and wellness.  Follow your health care provider's instructions about healthy diet, exercising, and getting tested or screened for diseases.  Follow your health care provider's instructions on monitoring your cholesterol and blood pressure. This information is not intended to  replace advice given to you by your health care provider. Make sure you discuss any questions you have with your health care provider. Document Released: 06/27/2007 Document Revised: 12/22/2017 Document Reviewed: 12/22/2017 Elsevier Patient Education  2020 [employer].

## 2018-09-16 NOTE — Assessment & Plan Note (Signed)
Improved readings today.

## 2018-09-18 LAB — VITAMIN B1: Vitamin B1 (Thiamine): <6 nmol/L — ABNORMAL LOW (ref 8–30)

## 2018-09-19 ENCOUNTER — Other Ambulatory Visit: Payer: Self-pay | Admitting: Family Medicine

## 2018-09-19 DIAGNOSIS — E519 Thiamine deficiency, unspecified: Secondary | ICD-10-CM | POA: Insufficient documentation

## 2018-09-19 MED ORDER — VITAMIN B-1 100 MG PO TABS: 100.0000 mg | ORAL_TABLET | Freq: Every day | ORAL | Status: DC

## 2018-09-23 DIAGNOSIS — F4323 Adjustment disorder with mixed anxiety and depressed mood: Secondary | ICD-10-CM | POA: Diagnosis not present

## 2018-10-07 DIAGNOSIS — F4323 Adjustment disorder with mixed anxiety and depressed mood: Secondary | ICD-10-CM | POA: Diagnosis not present

## 2018-10-12 ENCOUNTER — Other Ambulatory Visit: Payer: BLUE CROSS/BLUE SHIELD

## 2018-10-14 DIAGNOSIS — F4323 Adjustment disorder with mixed anxiety and depressed mood: Secondary | ICD-10-CM | POA: Diagnosis not present

## 2018-10-19 ENCOUNTER — Ambulatory Visit: Payer: BLUE CROSS/BLUE SHIELD | Admitting: Family Medicine

## 2018-10-28 DIAGNOSIS — F4323 Adjustment disorder with mixed anxiety and depressed mood: Secondary | ICD-10-CM | POA: Diagnosis not present

## 2018-11-25 DIAGNOSIS — F4323 Adjustment disorder with mixed anxiety and depressed mood: Secondary | ICD-10-CM | POA: Diagnosis not present

## 2018-12-15 ENCOUNTER — Other Ambulatory Visit: Payer: Self-pay | Admitting: Family Medicine

## 2018-12-16 ENCOUNTER — Ambulatory Visit: Payer: BC Managed Care – PPO | Admitting: Family Medicine

## 2018-12-21 ENCOUNTER — Other Ambulatory Visit: Payer: Self-pay | Admitting: Family Medicine

## 2018-12-22 ENCOUNTER — Telehealth: Payer: Self-pay | Admitting: Family Medicine

## 2018-12-30 MED ORDER — SERTRALINE HCL 25 MG PO TABS: 25.0000 mg | ORAL_TABLET | Freq: Every day | ORAL | 2 refills | Status: DC

## 2018-12-30 NOTE — Telephone Encounter (Signed)
Spoke with [facility] informing them the reason for refill refusal for sertraline 50 mg is because the provider has decreased strength to 25 mg.  E-scribed a refill for 25 mg.  Verbalized understanding and will fill for pt.   Attempted to contact pt. No answer.  Vm box is not set up.

## 2018-12-30 NOTE — Addendum Note (Signed)
Addended by: Brenton Grills on: [id number] 12:31 PM   Modules accepted: Orders

## 2018-12-30 NOTE — Telephone Encounter (Signed)
Patient came in to check on why medication was refused. I let him know that I would send the request for review.

## 2019-01-27 ENCOUNTER — Ambulatory Visit: Payer: BC Managed Care – PPO | Admitting: Family Medicine

## 2019-03-17 ENCOUNTER — Other Ambulatory Visit: Payer: Self-pay

## 2019-03-17 ENCOUNTER — Encounter: Payer: Self-pay | Admitting: Family Medicine

## 2019-03-17 ENCOUNTER — Ambulatory Visit: Payer: BLUE CROSS/BLUE SHIELD | Admitting: Family Medicine

## 2019-03-17 VITALS — BP 136/84 | HR 81 | Temp 97.8°F | Ht 70.0 in | Wt 178.1 lb

## 2019-03-17 DIAGNOSIS — F418 Other specified anxiety disorders: Secondary | ICD-10-CM

## 2019-03-17 DIAGNOSIS — R7401 Elevation of levels of liver transaminase levels: Secondary | ICD-10-CM | POA: Diagnosis not present

## 2019-03-17 DIAGNOSIS — E538 Deficiency of other specified B group vitamins: Secondary | ICD-10-CM

## 2019-03-17 DIAGNOSIS — F1020 Alcohol dependence, uncomplicated: Secondary | ICD-10-CM

## 2019-03-17 DIAGNOSIS — G4700 Insomnia, unspecified: Secondary | ICD-10-CM

## 2019-03-17 DIAGNOSIS — N281 Cyst of kidney, acquired: Secondary | ICD-10-CM | POA: Insufficient documentation

## 2019-03-17 DIAGNOSIS — E519 Thiamine deficiency, unspecified: Secondary | ICD-10-CM | POA: Diagnosis not present

## 2019-03-17 LAB — HEPATIC FUNCTION PANEL
ALT: 86 U/L — ABNORMAL HIGH (ref 0–53)
AST: 55 U/L — ABNORMAL HIGH (ref 0–37)
Albumin: 4.3 g/dL (ref 3.5–5.2)
Alkaline Phosphatase: 75 U/L (ref 39–117)
Bilirubin, Direct: 0.1 mg/dL (ref 0.0–0.3)
Total Bilirubin: 0.4 mg/dL (ref 0.2–1.2)
Total Protein: 7.4 g/dL (ref 6.0–8.3)

## 2019-03-17 LAB — VITAMIN B12: Vitamin B-12: 297 pg/mL (ref 211–911)

## 2019-03-17 MED ORDER — HYDROXYZINE HCL 25 MG PO TABS: 12.5000 mg | ORAL_TABLET | Freq: Two times a day (BID) | ORAL | 3 refills | Status: DC | PRN

## 2019-03-17 MED ORDER — SERTRALINE HCL 25 MG PO TABS: 25.0000 mg | ORAL_TABLET | Freq: Every day | ORAL | 3 refills | Status: DC

## 2019-03-17 NOTE — Assessment & Plan Note (Addendum)
Stable period on current regimen - continue.  Add hydroxyzine PRN anxiety/sleep.

## 2019-03-17 NOTE — Assessment & Plan Note (Signed)
Predominantly sleep maintenance insomnia.  Anticipate partly alcohol related. Trial hydroxyzine PRN anxiety/sleep.

## 2019-03-17 NOTE — Assessment & Plan Note (Signed)
Discussed this. He is drinking 2 beers/day. Affecting marital relationship. He is interested in fully quitting.  Discussed medication assistance - acamprosate vs naltrexone. Will check LFTs first.

## 2019-03-17 NOTE — Patient Instructions (Addendum)
Trial hydroxyzine 25mg  at night as needed for sleep or stress Labs today. If liver staying elevated, we will check abdominal ultrasound.  Continue sertraline 25mg  daily.  Good to see you today. Return as needed or in 6 months for physical.  If liver is ok, we may start naltrexone medicine to help you fully quit alcohol.

## 2019-03-17 NOTE — Assessment & Plan Note (Signed)
Update labs.  If elevated, check abd [place].

## 2019-03-17 NOTE — Progress Notes (Signed)
This visit was conducted in person.  BP 136/84 (BP Location: Left Arm, Patient Position: Sitting, Cuff Size: Normal)   Pulse 81   Temp 97.8 F (36.6 C) (Temporal)   Ht 5\' 10"  (1.778 m)   Wt 178 lb 2 oz (80.8 kg)   SpO2 99%   BMI 25.56 kg/m    CC: 6 mo f/u visit  Subjective:    Patient ID: Bradley Amel., male    DOB: 05/09/1960, 59 y.o.   MRN: [id number]  HPI: Bradley Supan. is a 59 y.o. male presenting on 03/17/2019 for Follow-up (Here for 3 mo mood f/u.)   See prior note for details.  Concern for depression with anxiety in setting of alcohol use disorder placed on sertraline, last visit tapered down to 25mg  daily due to anorexia at higher dose. Comes in today for follow up. Overall feeling well on medicine. Appetite is ok. Still having trouble with sleep maintenance insomnia.   Did not benefit from trazodone for sleep at night - didn't really work. Would like something in its place  Alcohol - has restarted some 2 beers/day - which is an improvement from prior.  Wife has left him due to his drinking. He is interested in medication to help. He is planning to start seeing local [place] alcohol renewal program which he states is comparable to AA.      Relevant past medical, surgical, family and social history reviewed and updated as indicated. Interim medical history since our last visit reviewed. Allergies and medications reviewed and updated. Outpatient Medications Prior to Visit  Medication Sig Dispense Refill  . Cyanocobalamin (B-12) 1000 MCG SUBL Place 1 tablet under the tongue daily.    Marland Kitchen MISC NATURAL PRODUCTS PO Take by mouth daily. Beet root    . omeprazole (PRILOSEC) 20 MG capsule Take 1 capsule (20 mg total) by mouth daily.    Marland Kitchen thiamine (VITAMIN B-1) 100 MG tablet Take 1 tablet (100 mg total) by mouth daily.    . sertraline (ZOLOFT) 25 MG tablet Take 1 tablet (25 mg total) by mouth daily. 30 tablet 2  . traZODone (DESYREL) 50 MG tablet Take  0.5-1 tablets (25-50 mg total) by mouth at bedtime as needed for sleep. 30 tablet 3   No facility-administered medications prior to visit.     Per HPI unless specifically indicated in ROS section below Review of Systems Objective:    BP 136/84 (BP Location: Left Arm, Patient Position: Sitting, Cuff Size: Normal)   Pulse 81   Temp 97.8 F (36.6 C) (Temporal)   Ht 5\' 10"  (1.778 m)   Wt 178 lb 2 oz (80.8 kg)   SpO2 99%   BMI 25.56 kg/m   Wt Readings from Last 3 Encounters:  03/17/19 178 lb 2 oz (80.8 kg)  09/16/18 171 lb 3 oz (77.7 kg)  08/15/18 176 lb 9 oz (80.1 kg)    Physical Exam Vitals and nursing note reviewed.  Constitutional:      Appearance: Normal appearance. He is not ill-appearing.  Cardiovascular:     Rate and Rhythm: Normal rate and regular rhythm.     Pulses: Normal pulses.     Heart sounds: Normal heart sounds. No murmur.  Pulmonary:     Effort: Pulmonary effort is normal. No respiratory distress.     Breath sounds: Normal breath sounds. No wheezing, rhonchi or rales.  Abdominal:     Palpations: There is no hepatomegaly.     Tenderness:  There is no abdominal tenderness.  Neurological:     Mental Status: He is alert.  Psychiatric:        Mood and Affect: Mood normal.        Behavior: Behavior normal.       Results for orders placed or performed in visit on 09/13/18  Vitamin B1  Result Value Ref Range   Vitamin B1 (Thiamine) <6 (L) 8 - 30 nmol/L  Folate  Result Value Ref Range   Folate 15.3 >5.9 ng/mL  Vitamin B12  Result Value Ref Range   Vitamin B-12 243 211 - 911 pg/mL  CBC with Differential/Platelet  Result Value Ref Range   WBC 7.7 4.0 - 10.5 K/uL   RBC 4.88 4.22 - 5.81 Mil/uL   Hemoglobin 14.8 13.0 - 17.0 g/dL   HCT 44.6 39.0 - 52.0 %   MCV 91.4 78.0 - 100.0 fl   MCHC 33.1 30.0 - 36.0 g/dL   RDW 19.0 (H) 11.5 - 15.5 %   Platelets 314.0 150.0 - 400.0 K/uL   Neutrophils Relative % 57.8 43.0 - 77.0 %   Lymphocytes Relative 25.3 12.0 -  46.0 %   Monocytes Relative 14.9 (H) 3.0 - 12.0 %   Eosinophils Relative 1.6 0.0 - 5.0 %   Basophils Relative 0.4 0.0 - 3.0 %   Neutro Abs 4.5 1.4 - 7.7 K/uL   Lymphs Abs 2.0 0.7 - 4.0 K/uL   Monocytes Absolute 1.2 (H) 0.1 - 1.0 K/uL   Eosinophils Absolute 0.1 0.0 - 0.7 K/uL   Basophils Absolute 0.0 0.0 - 0.1 K/uL  PSA  Result Value Ref Range   PSA 0.55 0.10 - 4.00 ng/mL  Comprehensive metabolic panel  Result Value Ref Range   Sodium 139 135 - 145 mEq/L   Potassium 4.1 3.5 - 5.1 mEq/L   Chloride 103 96 - 112 mEq/L   CO2 27 19 - 32 mEq/L   Glucose, Bld 99 70 - 99 mg/dL   BUN 15 6 - 23 mg/dL   Creatinine, Ser 0.84 0.40 - 1.50 mg/dL   Total Bilirubin 1.0 0.2 - 1.2 mg/dL   Alkaline Phosphatase 81 39 - 117 U/L   AST 47 (H) 0 - 37 U/L   ALT 54 (H) 0 - 53 U/L   Total Protein 7.2 6.0 - 8.3 g/dL   Albumin 4.2 3.5 - 5.2 g/dL   Calcium 9.1 8.4 - 10.5 mg/dL   GFR 94.51 >60.00 mL/min  Lipid panel  Result Value Ref Range   Cholesterol 193 0 - 200 mg/dL   Triglycerides 140.0 0.0 - 149.0 mg/dL   HDL 49.90 >39.00 mg/dL   VLDL 28.0 0.0 - 40.0 mg/dL   LDL Cholesterol 116 (H) 0 - 99 mg/dL   Total CHOL/HDL Ratio 4    NonHDL 143.52    Depression screen [facility] 2/9 03/17/2019 09/16/2018 08/15/2018  Decreased Interest 1 0 2  Down, Depressed, Hopeless 1 0 2  PHQ - 2 Score 2 0 4  Altered sleeping 2 2 2   Tired, decreased energy 1 0 0  Change in appetite 0 2 1  Feeling bad or failure about yourself  2 0 3  Trouble concentrating 2 1 3   Moving slowly or fidgety/restless 2 2 2   Suicidal thoughts 0 0 1  PHQ-9 Score 11 7 16     GAD 7 : Generalized Anxiety Score 03/17/2019 09/16/2018 08/15/2018  Nervous, Anxious, on Edge 2 2 3   Control/stop worrying 1 1 3   Worry too much -  different things 2 1 3   Trouble relaxing 2 2 3   Restless 2 2 2   Easily annoyed or irritable 1 1 1   Afraid - awful might happen 1 1 3   Total GAD 7 Score 11 10 18    Assessment & Plan:  This visit occurred during the SARS-CoV-2 public  health emergency.  Safety protocols were in place, including screening questions prior to the visit, additional usage of staff PPE, and extensive cleaning of exam room while observing appropriate contact time as indicated for disinfecting solutions.   Problem List Items Addressed This Visit    Transaminitis - Primary    Update labs.  If elevated, check abd [place].       Relevant Orders   Hepatic function panel   Thiamine deficiency   Relevant Orders   Vitamin B1   Low serum vitamin B12   Relevant Orders   Vitamin B12   Insomnia    Predominantly sleep maintenance insomnia.  Anticipate partly alcohol related. Trial hydroxyzine PRN anxiety/sleep.       Depression with anxiety    Stable period on current regimen - continue.  Add hydroxyzine PRN anxiety/sleep.       Relevant Medications   sertraline (ZOLOFT) 25 MG tablet   hydrOXYzine (ATARAX/VISTARIL) 25 MG tablet   Alcohol dependence ([place])    Discussed this. He is drinking 2 beers/day. Affecting marital relationship. He is interested in fully quitting.  Discussed medication assistance - acamprosate vs naltrexone. Will check LFTs first.       Acquired renal cyst of right kidney       Meds ordered this encounter  Medications  . sertraline (ZOLOFT) 25 MG tablet    Sig: Take 1 tablet (25 mg total) by mouth daily.    Dispense:  90 tablet    Refill:  3  . hydrOXYzine (ATARAX/VISTARIL) 25 MG tablet    Sig: Take 0.5-1 tablets (12.5-25 mg total) by mouth 2 (two) times daily as needed for anxiety (sleep).    Dispense:  30 tablet    Refill:  3   Orders Placed This Encounter  Procedures  . Vitamin B12  . Vitamin B1  . Hepatic function panel    Patient Instructions  Trial hydroxyzine 25mg  at night as needed for sleep or stress Labs today. If liver staying elevated, we will check abdominal ultrasound.  Continue sertraline 25mg  daily.  Good to see you today. Return as needed or in 6 months for physical.  If liver is ok, we may  start naltrexone medicine to help you fully quit alcohol.    Follow up plan: No follow-ups on file.  Ria Bush, MD

## 2019-03-23 LAB — VITAMIN B1: Vitamin B1 (Thiamine): 50 nmol/L — ABNORMAL HIGH (ref 8–30)

## 2019-04-03 ENCOUNTER — Other Ambulatory Visit: Payer: Self-pay | Admitting: Family Medicine

## 2019-04-03 DIAGNOSIS — N281 Cyst of kidney, acquired: Secondary | ICD-10-CM

## 2019-04-03 DIAGNOSIS — R7401 Elevation of levels of liver transaminase levels: Secondary | ICD-10-CM

## 2019-04-05 ENCOUNTER — Telehealth: Payer: Self-pay | Admitting: Family Medicine

## 2019-04-05 NOTE — Telephone Encounter (Signed)
Pt aware of results.   See result note.   He will be calling back this afternoon with medication clarification -- please send note to Dr Darnell Level once he calls back. Thanks.

## 2019-04-05 NOTE — Telephone Encounter (Signed)
Pt returned your call.  

## 2019-04-06 NOTE — Telephone Encounter (Signed)
Noted.  Spoke with pt asking about vit B12.  States he was taking vit B6 but has recently started vit B12 1000 mcg as instructed by Dr. Coralie Keens Labs Result Note, 03/17/19].  FYI to Dr. Darnell Level.

## 2019-04-07 NOTE — Telephone Encounter (Signed)
Noted. Thanks.

## 2019-04-07 NOTE — Addendum Note (Signed)
Addended by: Ria Bush on: 04/07/2019 07:13 AM   Modules accepted: Orders

## 2019-04-11 ENCOUNTER — Ambulatory Visit
Admission: RE | Admit: 2019-04-11 | Discharge: 2019-04-11 | Disposition: A | Discharge status: Home / Self Care | Payer: BLUE CROSS/BLUE SHIELD | Source: Ambulatory Visit | Attending: Family Medicine | Admitting: Family Medicine

## 2019-04-11 ENCOUNTER — Other Ambulatory Visit: Payer: Self-pay

## 2019-04-11 DIAGNOSIS — R7401 Elevation of levels of liver transaminase levels: Secondary | ICD-10-CM | POA: Diagnosis not present

## 2019-04-11 DIAGNOSIS — N281 Cyst of kidney, acquired: Secondary | ICD-10-CM | POA: Insufficient documentation

## 2019-05-23 ENCOUNTER — Other Ambulatory Visit: Payer: Self-pay | Admitting: Family Medicine

## 2019-05-24 NOTE — Telephone Encounter (Signed)
Message from pharmacy requesting 90-day rx.  Is this ok?

## 2019-09-22 ENCOUNTER — Encounter: Payer: BLUE CROSS/BLUE SHIELD | Admitting: Family Medicine

## 2019-11-15 ENCOUNTER — Telehealth: Payer: Self-pay | Admitting: Family Medicine

## 2019-11-15 ENCOUNTER — Encounter: Payer: BLUE CROSS/BLUE SHIELD | Admitting: Family Medicine

## 2019-11-15 NOTE — Telephone Encounter (Signed)
Patient came in for physical today and he had diarrhea.  Patient rescheduled physical to 01/15/20 and labs the week before.  Patient said the last time he was in his b12 was high.  Do you want patient to have labs sooner for b12 than January or can it wait until January?

## 2019-11-16 NOTE — Telephone Encounter (Signed)
Ok to wait for physical for labs Would offer sooner appt in open 30 min slot.

## 2019-11-16 NOTE — Telephone Encounter (Signed)
Patient wanted a 4:00 appointment for his physical.  Will you do back to back physicals?

## 2019-11-16 NOTE — Telephone Encounter (Signed)
Ok to do if availability. Thanks

## 2019-11-27 ENCOUNTER — Other Ambulatory Visit: Payer: Self-pay | Admitting: Family Medicine

## 2019-12-03 ENCOUNTER — Other Ambulatory Visit: Payer: Self-pay | Admitting: Family Medicine

## 2019-12-03 DIAGNOSIS — E519 Thiamine deficiency, unspecified: Secondary | ICD-10-CM

## 2019-12-03 DIAGNOSIS — E538 Deficiency of other specified B group vitamins: Secondary | ICD-10-CM

## 2019-12-03 DIAGNOSIS — N400 Enlarged prostate without lower urinary tract symptoms: Secondary | ICD-10-CM

## 2019-12-03 DIAGNOSIS — E785 Hyperlipidemia, unspecified: Secondary | ICD-10-CM

## 2019-12-04 ENCOUNTER — Other Ambulatory Visit: Payer: Self-pay

## 2019-12-04 ENCOUNTER — Other Ambulatory Visit (INDEPENDENT_AMBULATORY_CARE_PROVIDER_SITE_OTHER): Payer: BLUE CROSS/BLUE SHIELD

## 2019-12-04 DIAGNOSIS — N400 Enlarged prostate without lower urinary tract symptoms: Secondary | ICD-10-CM

## 2019-12-04 DIAGNOSIS — E785 Hyperlipidemia, unspecified: Secondary | ICD-10-CM | POA: Diagnosis not present

## 2019-12-04 DIAGNOSIS — E538 Deficiency of other specified B group vitamins: Secondary | ICD-10-CM

## 2019-12-04 DIAGNOSIS — E519 Thiamine deficiency, unspecified: Secondary | ICD-10-CM

## 2019-12-04 LAB — VITAMIN B12: Vitamin B-12: 720 pg/mL (ref 211–911)

## 2019-12-04 LAB — COMPREHENSIVE METABOLIC PANEL
ALT: 91 U/L — ABNORMAL HIGH (ref 0–53)
AST: 54 U/L — ABNORMAL HIGH (ref 0–37)
Albumin: 4.1 g/dL (ref 3.5–5.2)
Alkaline Phosphatase: 69 U/L (ref 39–117)
BUN: 13 mg/dL (ref 6–23)
CO2: 30 mEq/L (ref 19–32)
Calcium: 9 mg/dL (ref 8.4–10.5)
Chloride: 106 mEq/L (ref 96–112)
Creatinine, Ser: 0.77 mg/dL (ref 0.40–1.50)
GFR: 99.56 mL/min (ref >60.00)
Glucose, Bld: 113 mg/dL — ABNORMAL HIGH (ref 70–99)
Potassium: 4.6 mEq/L (ref 3.5–5.1)
Sodium: 142 mEq/L (ref 135–145)
Total Bilirubin: 0.6 mg/dL (ref 0.2–1.2)
Total Protein: 6.8 g/dL (ref 6.0–8.3)

## 2019-12-04 LAB — LIPID PANEL
Cholesterol: 167 mg/dL (ref 0–200)
HDL: 44.8 mg/dL (ref >39.00)
LDL Cholesterol: 103 mg/dL — ABNORMAL HIGH (ref 0–99)
NonHDL: 122.15
Total CHOL/HDL Ratio: 4
Triglycerides: 96 mg/dL (ref 0.0–149.0)
VLDL: 19.2 mg/dL (ref 0.0–40.0)

## 2019-12-04 LAB — PSA: PSA: 0.46 ng/mL (ref 0.10–4.00)

## 2019-12-05 ENCOUNTER — Ambulatory Visit (INDEPENDENT_AMBULATORY_CARE_PROVIDER_SITE_OTHER): Payer: BLUE CROSS/BLUE SHIELD | Admitting: Family Medicine

## 2019-12-05 ENCOUNTER — Encounter: Payer: Self-pay | Admitting: Family Medicine

## 2019-12-05 VITALS — BP 134/84 | HR 85 | Temp 97.6°F | Ht 69.75 in | Wt 182.1 lb

## 2019-12-05 DIAGNOSIS — F1021 Alcohol dependence, in remission: Secondary | ICD-10-CM

## 2019-12-05 DIAGNOSIS — Z1211 Encounter for screening for malignant neoplasm of colon: Secondary | ICD-10-CM | POA: Diagnosis not present

## 2019-12-05 DIAGNOSIS — Z0000 Encounter for general adult medical examination without abnormal findings: Secondary | ICD-10-CM

## 2019-12-05 DIAGNOSIS — K760 Fatty (change of) liver, not elsewhere classified: Secondary | ICD-10-CM

## 2019-12-05 DIAGNOSIS — E785 Hyperlipidemia, unspecified: Secondary | ICD-10-CM

## 2019-12-05 DIAGNOSIS — K219 Gastro-esophageal reflux disease without esophagitis: Secondary | ICD-10-CM | POA: Diagnosis not present

## 2019-12-05 DIAGNOSIS — E538 Deficiency of other specified B group vitamins: Secondary | ICD-10-CM

## 2019-12-05 DIAGNOSIS — N401 Enlarged prostate with lower urinary tract symptoms: Secondary | ICD-10-CM

## 2019-12-05 DIAGNOSIS — R351 Nocturia: Secondary | ICD-10-CM | POA: Diagnosis not present

## 2019-12-05 DIAGNOSIS — F418 Other specified anxiety disorders: Secondary | ICD-10-CM

## 2019-12-05 MED ORDER — SERTRALINE HCL 25 MG PO TABS
25.0000 mg | ORAL_TABLET | Freq: Every day | ORAL | 3 refills | Status: DC
Start: 2019-12-05 — End: 2019-12-14

## 2019-12-05 NOTE — Assessment & Plan Note (Signed)
Chronic, improved readings with less alcohol intake.  The 10-year ASCVD risk score Mikey Bussing DC Brooke Bonito., et al., 2013) is: 6.5%   Values used to calculate the score:     Age: 59 years     Sex: Male     Is Non-Hispanic African American: No     Diabetic: No     Tobacco smoker: No     Systolic Blood Pressure: 037 mmHg     Is BP treated: No     HDL Cholesterol: 44.8 mg/dL     Total Cholesterol: 167 mg/dL

## 2019-12-05 NOTE — Progress Notes (Signed)
Patient ID: Bradley Gonzales., male    DOB: Jun 16, 1960, 59 y.o.   MRN: [id number]  This visit was conducted in person.  BP 134/84 (BP Location: Left Arm, Patient Position: Sitting, Cuff Size: Normal)   Pulse 85   Temp 97.6 F (36.4 C) (Temporal)   Ht 5' 9.75" (1.772 m)   Wt 182 lb 2 oz (82.6 kg)   SpO2 98%   BMI 26.32 kg/m    CC: CPE Subjective:   HPI: [name] is a 59 y.o. male presenting on 12/05/2019 for Annual Exam   H/o depression with anxiety in h/o alcohol use disorder. Finds sertraline 25mg  has helped significantly but notes ongoing trouble unwinding at night.   Fully quit liquor 05/2019, still drinking 1-2 beers every other day but limits. Wife planning to move back in this weekend.   Transaminitis - US showed fatty liver as well as simple R 3.2cm renal cyst.   Noted lump to R groin/inner thigh that resolved on its own over 1 month.   Preventative: Colon cancer - discussed. Requests GI referral.  Prostate cancer screening - DRE with enlarged prostate 09/2012. Nocturia 1-3. PSA reassuring.  Flu shot -declines  Tetanus 2011 - declines  COVID vaccine - discussed, declines  Pneumovax -  Hep B series - shingrix -  Seat belt discussed  Sunscreen use discussed, no changing moles on skin Non smoker.  Alcohol use - down to 1 beer a night Dentist q6 mo Eye exam yearly  Caffeine: coke, dr pepper, tea  Lives with wife Baker Janus) and 1 son [name], 1 dog  Occupation: Receiving clerk  Edu: HS  Activity: fishes, walking 1+ miles a few times a week  Diet: good water intake, some fruits/vegetables     Relevant past medical, surgical, family and social history reviewed and updated as indicated. Interim medical history since our last visit reviewed. Allergies and medications reviewed and updated. Outpatient Medications Prior to Visit  Medication Sig Dispense Refill  . Cyanocobalamin (B-12) 1000 MCG SUBL Place 1 tablet under the tongue daily.    .  hydrOXYzine (ATARAX/VISTARIL) 25 MG tablet TAKE 0.5-1 TABLETS (12.5-25 MG TOTAL) BY MOUTH 2 (TWO) TIMES DAILY AS NEEDED FOR ANXIETY (SLEEP). 180 tablet 1  . MISC NATURAL PRODUCTS PO Take by mouth daily. Beet root    . omeprazole (PRILOSEC) 20 MG capsule Take 1 capsule (20 mg total) by mouth daily.    . sertraline (ZOLOFT) 25 MG tablet Take 1 tablet (25 mg total) by mouth daily. 90 tablet 3   No facility-administered medications prior to visit.     Per HPI unless specifically indicated in ROS section below Review of Systems  Constitutional: Negative for activity change, appetite change, chills, fatigue, fever and unexpected weight change.  HENT: Negative for hearing loss.   Eyes: Negative for visual disturbance.  Respiratory: Negative for cough, chest tightness, shortness of breath and wheezing.   Cardiovascular: Negative for chest pain, palpitations and leg swelling.  Gastrointestinal: Negative for abdominal distention, abdominal pain, blood in stool, constipation, diarrhea, nausea and vomiting.  Genitourinary: Negative for difficulty urinating and hematuria.  Musculoskeletal: Negative for arthralgias, myalgias and neck pain.  Skin: Negative for rash.  Neurological: Negative for dizziness, seizures, syncope and headaches.  Hematological: Negative for adenopathy. Does not bruise/bleed easily.  Psychiatric/Behavioral: Negative for dysphoric mood. The patient is not nervous/anxious.    Objective:  BP 134/84 (BP Location: Left Arm, Patient Position: Sitting, Cuff Size: Normal)   Pulse  85   Temp 97.6 F (36.4 C) (Temporal)   Ht 5' 9.75" (1.772 m)   Wt 182 lb 2 oz (82.6 kg)   SpO2 98%   BMI 26.32 kg/m   Wt Readings from Last 3 Encounters:  12/05/19 182 lb 2 oz (82.6 kg)  03/17/19 178 lb 2 oz (80.8 kg)  09/16/18 171 lb 3 oz (77.7 kg)      Physical Exam Vitals and nursing note reviewed.  Constitutional:      General: He is not in acute distress.    Appearance: He is well-developed.   HENT:     Head: Normocephalic and atraumatic.     Right Ear: Hearing, tympanic membrane, ear canal and external ear normal.     Left Ear: Hearing, tympanic membrane, ear canal and external ear normal.     Nose: Nose normal.     Mouth/Throat:     Pharynx: Uvula midline. No oropharyngeal exudate or posterior oropharyngeal erythema.  Eyes:     General: No scleral icterus.    Conjunctiva/sclera: Conjunctivae normal.     Pupils: Pupils are equal, round, and reactive to light.  Cardiovascular:     Rate and Rhythm: Normal rate and regular rhythm.     Pulses:          Radial pulses are 2+ on the right side and 2+ on the left side.     Heart sounds: Normal heart sounds. No murmur heard.   Pulmonary:     Effort: Pulmonary effort is normal. No respiratory distress.     Breath sounds: Normal breath sounds. No wheezing or rales.  Abdominal:     General: Bowel sounds are normal. There is no distension.     Palpations: Abdomen is soft. There is no mass.     Tenderness: There is no abdominal tenderness. There is no guarding or rebound.  Musculoskeletal:        General: Normal range of motion.     Cervical back: Normal range of motion and neck supple.  Lymphadenopathy:     Cervical: No cervical adenopathy.  Skin:    General: Skin is warm and dry.     Findings: No rash.  Neurological:     Mental Status: He is alert and oriented to person, place, and time.     Comments: CN grossly intact, station and gait intact  Psychiatric:        Behavior: Behavior normal.        Thought Content: Thought content normal.        Judgment: Judgment normal.       Results for orders placed or performed in visit on 12/04/19  PSA  Result Value Ref Range   PSA 0.46 0.10 - 4.00 ng/mL  Vitamin B12  Result Value Ref Range   Vitamin B-12 720 211 - 911 pg/mL  Comprehensive metabolic panel  Result Value Ref Range   Sodium 142 135 - 145 mEq/L   Potassium 4.6 3.5 - 5.1 mEq/L   Chloride 106 96 - 112 mEq/L    CO2 30 19 - 32 mEq/L   Glucose, Bld 113 (H) 70 - 99 mg/dL   BUN 13 6 - 23 mg/dL   Creatinine, Ser 0.77 0.40 - 1.50 mg/dL   Total Bilirubin 0.6 0.2 - 1.2 mg/dL   Alkaline Phosphatase 69 39 - 117 U/L   AST 54 (H) 0 - 37 U/L   ALT 91 (H) 0 - 53 U/L   Total Protein 6.8 6.0 - 8.3  g/dL   Albumin 4.1 3.5 - 5.2 g/dL   GFR 99.56 >60.00 mL/min   Calcium 9.0 8.4 - 10.5 mg/dL  Lipid panel  Result Value Ref Range   Cholesterol 167 0 - 200 mg/dL   Triglycerides 96.0 0 - 149 mg/dL   HDL 44.80 >39.00 mg/dL   VLDL 19.2 0.0 - 40.0 mg/dL   LDL Cholesterol 103 (H) 0 - 99 mg/dL   Total CHOL/HDL Ratio 4    NonHDL 122.15    Depression screen [facility] 2/9 12/05/2019 03/17/2019 09/16/2018 08/15/2018  Decreased Interest 1 1 0 2  Down, Depressed, Hopeless 0 1 0 2  PHQ - 2 Score 1 2 0 4  Altered sleeping 1 2 2 2   Tired, decreased energy 0 1 0 0  Change in appetite 1 0 2 1  Feeling bad or failure about yourself  1 2 0 3  Trouble concentrating 1 2 1 3   Moving slowly or fidgety/restless 0 2 2 2   Suicidal thoughts 0 0 0 1  PHQ-9 Score 5 11 7 16     GAD 7 : Generalized Anxiety Score 12/05/2019 03/17/2019 09/16/2018 08/15/2018  Nervous, Anxious, on Edge 2 2 2 3   Control/stop worrying 1 1 1 3   Worry too much - different things 1 2 1 3   Trouble relaxing 1 2 2 3   Restless 2 2 2 2   Easily annoyed or irritable 0 1 1 1   Afraid - awful might happen 0 1 1 3   Total GAD 7 Score 7 11 10 18    Assessment & Plan:  This visit occurred during the SARS-CoV-2 public health emergency.  Safety protocols were in place, including screening questions prior to the visit, additional usage of staff PPE, and extensive cleaning of exam room while observing appropriate contact time as indicated for disinfecting solutions.   Problem List Items Addressed This Visit    Low serum vitamin B12    Levels repleted on 1000 mcg daily oral repalcement.       HLD (hyperlipidemia)    Chronic, improved readings with less alcohol intake.  The 10-year ASCVD  risk score Mikey Bussing DC Brooke Bonito., et al., 2013) is: 6.5%   Values used to calculate the score:     Age: 53 years     Sex: Male     Is Non-Hispanic African American: No     Diabetic: No     Tobacco smoker: No     Systolic Blood Pressure: 416 mmHg     Is BP treated: No     HDL Cholesterol: 44.8 mg/dL     Total Cholesterol: 167 mg/dL       History of alcohol dependence ([place])    Has been able to decrease alcohol intake to 1-2 beers every few days, has fully quit hard liquor. Congratulated, encouraged ongoing restriction.       Healthcare maintenance - Primary    Preventative protocols reviewed and updated unless pt declined. Discussed healthy diet and lifestyle.       GERD (gastroesophageal reflux disease)    Stable period on daily omeprazole 20mg       Fatty liver    Abd [place] 03/2019 showing fatty liver - in setting of significant alcohol use history possible component of alcoholic liver disease, but also possible NASH.  He has already cut down on alcohol use - congratulated. Will continue to monitor.  Consider Hep B series and pneumovax if not completed previously.       Depression with anxiety  Ongoing trouble doing well on setraline 25mg  daily - notes difficulty unwinding at night. Discussed option to increase to 50mg . Will restart hydroxyzine, update with effect.       Relevant Medications   sertraline (ZOLOFT) 25 MG tablet   Benign prostatic hyperplasia    H/o this, PSA reassuring.        Other Visit Diagnoses    Special screening for malignant neoplasms, colon       Relevant Orders   Ambulatory referral to Gastroenterology   Nocturia           Meds ordered this encounter  Medications  . sertraline (ZOLOFT) 25 MG tablet    Sig: Take 1 tablet (25 mg total) by mouth daily.    Dispense:  90 tablet    Refill:  3   Orders Placed This Encounter  Procedures  . Ambulatory referral to Gastroenterology    Referral Priority:   Routine    Referral Type:   Consultation     Referral Reason:   Specialty Services Required    Number of Visits Requested:   1    Patient instructions: You are doing well today Good job with cutting down alcohol.  Continue sertraline 25mg  nightly - we have room to increase dose if needed. Try hydroxyzine again at night time as needed for stress/sleep.  Return as needed or in 1 year for next physical.   Follow up plan: Return in about 1 year (around 12/04/2020) for annual exam, prior fasting for blood work.  Ria Bush, MD

## 2019-12-05 NOTE — Patient Instructions (Addendum)
You are doing well today Good job with cutting down alcohol.  Continue sertraline 25mg  nightly - we have room to increase dose if needed. Try hydroxyzine again at night time as needed for stress/sleep.  Return as needed or in 1 year for next physical.   Health Maintenance, Male Adopting a healthy lifestyle and getting preventive care are important in promoting health and wellness. Ask your health care provider about:  The right schedule for you to have regular tests and exams.  Things you can do on your own to prevent diseases and keep yourself healthy. What should I know about diet, weight, and exercise? Eat a healthy diet   Eat a diet that includes plenty of vegetables, fruits, low-fat dairy products, and lean protein.  Do not eat a lot of foods that are high in solid fats, added sugars, or sodium. Maintain a healthy weight Body mass index (BMI) is a measurement that can be used to identify possible weight problems. It estimates body fat based on height and weight. Your health care provider can help determine your BMI and help you achieve or maintain a healthy weight. Get regular exercise Get regular exercise. This is one of the most important things you can do for your health. Most adults should:  Exercise for at least 150 minutes each week. The exercise should increase your heart rate and make you sweat (moderate-intensity exercise).  Do strengthening exercises at least twice a week. This is in addition to the moderate-intensity exercise.  Spend less time sitting. Even light physical activity can be beneficial. Watch cholesterol and blood lipids Have your blood tested for lipids and cholesterol at 57 years of age, then have this test every 5 years. You may need to have your cholesterol levels checked more often if:  Your lipid or cholesterol levels are high.  You are older than 57 years of age.  You are at high risk for heart disease. What should I know about cancer  screening? Many types of cancers can be detected early and may often be prevented. Depending on your health history and family history, you may need to have cancer screening at various ages. This may include screening for:  Colorectal cancer.  Prostate cancer.  Skin cancer.  Lung cancer. What should I know about heart disease, diabetes, and high blood pressure? Blood pressure and heart disease  High blood pressure causes heart disease and increases the risk of stroke. This is more likely to develop in people who have high blood pressure readings, are of African descent, or are overweight.  Talk with your health care provider about your target blood pressure readings.  Have your blood pressure checked: ? Every 3-5 years if you are 55-73 years of age. ? Every year if you are 6 years old or older.  If you are between the ages of 78 and 35 and are a current or former smoker, ask your health care provider if you should have a one-time screening for abdominal aortic aneurysm (AAA). Diabetes Have regular diabetes screenings. This checks your fasting blood sugar level. Have the screening done:  Once every three years after age 19 if you are at a normal weight and have a low risk for diabetes.  More often and at a younger age if you are overweight or have a high risk for diabetes. What should I know about preventing infection? Hepatitis B If you have a higher risk for hepatitis B, you should be screened for this virus. Talk with your health  care provider to find out if you are at risk for hepatitis B infection. Hepatitis C Blood testing is recommended for:  Everyone born from [birth year] through 1963.  Anyone with known risk factors for hepatitis C. Sexually transmitted infections (STIs)  You should be screened each year for STIs, including gonorrhea and chlamydia, if: ? You are sexually active and are younger than 57 years of age. ? You are older than 57 years of age and your health care  provider tells you that you are at risk for this type of infection. ? Your sexual activity has changed since you were last screened, and you are at increased risk for chlamydia or gonorrhea. Ask your health care provider if you are at risk.  Ask your health care provider about whether you are at high risk for HIV. Your health care provider may recommend a prescription medicine to help prevent HIV infection. If you choose to take medicine to prevent HIV, you should first get tested for HIV. You should then be tested every 3 months for as long as you are taking the medicine. Follow these instructions at home: Lifestyle  Do not use any products that contain nicotine or tobacco, such as cigarettes, e-cigarettes, and chewing tobacco. If you need help quitting, ask your health care provider.  Do not use street drugs.  Do not share needles.  Ask your health care provider for help if you need support or information about quitting drugs. Alcohol use  Do not drink alcohol if your health care provider tells you not to drink.  If you drink alcohol: ? Limit how much you have to 0-2 drinks a day. ? Be aware of how much alcohol is in your drink. In the [place] one drink equals one 12 oz bottle of beer (355 mL), one 5 oz glass of wine (148 mL), or one 1 oz glass of hard liquor (44 mL). General instructions  Schedule regular health, dental, and eye exams.  Stay current with your vaccines.  Tell your health care provider if: ? You often feel depressed. ? You have ever been abused or do not feel safe at home. Summary  Adopting a healthy lifestyle and getting preventive care are important in promoting health and wellness.  Follow your health care provider's instructions about healthy diet, exercising, and getting tested or screened for diseases.  Follow your health care provider's instructions on monitoring your cholesterol and blood pressure. This information is not intended to replace advice given  to you by your health care provider. Make sure you discuss any questions you have with your health care provider. Document Revised: 12/22/2017 Document Reviewed: 12/22/2017 Elsevier Patient Education  2020 [employer].

## 2019-12-05 NOTE — Assessment & Plan Note (Signed)
H/o this, PSA reassuring.

## 2019-12-05 NOTE — Assessment & Plan Note (Deleted)
Ongoing, not bothersome.

## 2019-12-05 NOTE — Assessment & Plan Note (Signed)
Levels repleted on 1000 mcg daily oral repalcement.

## 2019-12-05 NOTE — Assessment & Plan Note (Addendum)
Abd [place] 03/2019 showing fatty liver - in setting of significant alcohol use history possible component of alcoholic liver disease, but also possible NASH.  He has already cut down on alcohol use - congratulated. Will continue to monitor.  Consider Hep B series and pneumovax if not completed previously.

## 2019-12-05 NOTE — Assessment & Plan Note (Addendum)
Has been able to decrease alcohol intake to 1-2 beers every few days, has fully quit hard liquor. Congratulated, encouraged ongoing restriction.

## 2019-12-05 NOTE — Assessment & Plan Note (Signed)
Preventative protocols reviewed and updated unless pt declined. Discussed healthy diet and lifestyle.  

## 2019-12-05 NOTE — Assessment & Plan Note (Signed)
Ongoing trouble doing well on setraline 25mg  daily - notes difficulty unwinding at night. Discussed option to increase to 50mg . Will restart hydroxyzine, update with effect.

## 2019-12-05 NOTE — Assessment & Plan Note (Signed)
Stable period on daily omeprazole 20mg 

## 2019-12-11 LAB — VITAMIN B1: Vitamin B1 (Thiamine): 7 nmol/L — ABNORMAL LOW (ref 8–30)

## 2019-12-12 ENCOUNTER — Telehealth: Payer: Self-pay

## 2019-12-12 ENCOUNTER — Other Ambulatory Visit: Payer: Self-pay | Admitting: Family Medicine

## 2019-12-12 MED ORDER — VITAMIN B-1 50 MG PO TABS: 50.0000 mg | ORAL_TABLET | Freq: Every day | ORAL | Status: DC

## 2019-12-12 NOTE — Telephone Encounter (Signed)
Pt states he's having a reaction to the hydroxyzine.  Causing itching.  States he did not take it last night and was fine.  Requests an alternative to med.

## 2019-12-14 MED ORDER — SERTRALINE HCL 50 MG PO TABS
50.0000 mg | ORAL_TABLET | Freq: Every day | ORAL | 3 refills | Status: DC
Start: 2019-12-14 — End: 2020-12-11

## 2019-12-14 NOTE — Telephone Encounter (Signed)
Ok stop hydroxyzine.  Instead I recommend he increase sertraline to 50mg  daily - double up on 25mg  tablets he currently has, new dose for 50mg  will be at pharmacy.

## 2019-12-14 NOTE — Addendum Note (Signed)
Addended by: Ria Bush on: 12/14/2019 11:43 AM   Modules accepted: Orders

## 2019-12-15 NOTE — Telephone Encounter (Signed)
Spoke with pt relaying Dr. G's message.  Pt verbalizes understanding and expresses his thanks.  

## 2019-12-21 ENCOUNTER — Other Ambulatory Visit: Payer: Self-pay

## 2019-12-21 ENCOUNTER — Telehealth (INDEPENDENT_AMBULATORY_CARE_PROVIDER_SITE_OTHER): Payer: Self-pay | Admitting: Gastroenterology

## 2019-12-21 DIAGNOSIS — Z1211 Encounter for screening for malignant neoplasm of colon: Secondary | ICD-10-CM

## 2019-12-21 MED ORDER — PEG 3350-KCL-NA BICARB-NACL 420 G PO SOLR: 4000.0000 mL | Freq: Once | ORAL | 0 refills | Status: AC

## 2019-12-21 NOTE — Progress Notes (Signed)
Gastroenterology Pre-Procedure Review  Request Date: 02/09/20 Requesting Physician: Dr. Vicente Males  PATIENT REVIEW QUESTIONS: The patient responded to the following health history questions as indicated:    1. Are you having any GI issues? no 2. Do you have a personal history of Polyps? no 3. Do you have a family history of Colon Cancer or Polyps? no 4. Diabetes Mellitus? no 5. Joint replacements in the past 12 months?no 6. Major health problems in the past 3 months?no 7. Any artificial heart valves, MVP, or defibrillator?no    MEDICATIONS & ALLERGIES:    Patient reports the following regarding taking any anticoagulation/antiplatelet therapy:   Plavix, Coumadin, Eliquis, Xarelto, Lovenox, Pradaxa, Brilinta, or Effient? no Aspirin? no  Patient confirms/reports the following medications:  Current Outpatient Medications  Medication Sig Dispense Refill  . Cyanocobalamin (B-12) 1000 MCG SUBL Place 1 tablet under the tongue daily.    Marland Kitchen MISC NATURAL PRODUCTS PO Take by mouth daily. Beet root    . omeprazole (PRILOSEC) 20 MG capsule Take 1 capsule (20 mg total) by mouth daily.    . sertraline (ZOLOFT) 50 MG tablet Take 1 tablet (50 mg total) by mouth daily. 90 tablet 3  . thiamine (VITAMIN B-1) 50 MG tablet Take 1 tablet (50 mg total) by mouth daily.     No current facility-administered medications for this visit.    Patient confirms/reports the following allergies:  Allergies  Allergen Reactions  . Hydroxyzine Itching    Orders Placed This Encounter  Procedures  . Procedural/ Surgical Case Request: COLONOSCOPY WITH PROPOFOL    Standing Status:   Standing    Number of Occurrences:   1    Order Specific Question:   Pre-op diagnosis    Answer:   screening colonoscopy    Order Specific Question:   CPT Code    Answer:   02725    AUTHORIZATION INFORMATION Primary Insurance: 1D#: Group #:  Secondary Insurance: 1D#: Group #:  SCHEDULE INFORMATION: Date:  02/09/20 Time: Location:[facility]

## 2020-01-09 ENCOUNTER — Other Ambulatory Visit: Payer: BLUE CROSS/BLUE SHIELD

## 2020-01-15 ENCOUNTER — Encounter: Payer: BLUE CROSS/BLUE SHIELD | Admitting: Family Medicine

## 2020-02-07 ENCOUNTER — Other Ambulatory Visit: Payer: Self-pay

## 2020-02-07 ENCOUNTER — Other Ambulatory Visit
Admission: RE | Admit: 2020-02-07 | Discharge: 2020-02-07 | Disposition: A | Discharge status: Home / Self Care | Payer: BLUE CROSS/BLUE SHIELD | Source: Ambulatory Visit | Attending: Gastroenterology | Admitting: Gastroenterology

## 2020-02-07 DIAGNOSIS — Z888 Allergy status to other drugs, medicaments and biological substances status: Secondary | ICD-10-CM | POA: Diagnosis not present

## 2020-02-07 DIAGNOSIS — D123 Benign neoplasm of transverse colon: Secondary | ICD-10-CM | POA: Diagnosis not present

## 2020-02-07 DIAGNOSIS — Z01812 Encounter for preprocedural laboratory examination: Secondary | ICD-10-CM | POA: Insufficient documentation

## 2020-02-07 DIAGNOSIS — D122 Benign neoplasm of ascending colon: Secondary | ICD-10-CM | POA: Diagnosis not present

## 2020-02-07 DIAGNOSIS — Z20822 Contact with and (suspected) exposure to covid-19: Secondary | ICD-10-CM | POA: Insufficient documentation

## 2020-02-07 DIAGNOSIS — Z9049 Acquired absence of other specified parts of digestive tract: Secondary | ICD-10-CM | POA: Diagnosis not present

## 2020-02-07 DIAGNOSIS — Z79899 Other long term (current) drug therapy: Secondary | ICD-10-CM | POA: Diagnosis not present

## 2020-02-07 DIAGNOSIS — Z87891 Personal history of nicotine dependence: Secondary | ICD-10-CM | POA: Diagnosis not present

## 2020-02-07 DIAGNOSIS — D125 Benign neoplasm of sigmoid colon: Secondary | ICD-10-CM | POA: Diagnosis not present

## 2020-02-07 DIAGNOSIS — Z1211 Encounter for screening for malignant neoplasm of colon: Secondary | ICD-10-CM | POA: Diagnosis not present

## 2020-02-07 LAB — SARS CORONAVIRUS 2 (TAT 6-24 HRS): SARS Coronavirus 2: NEGATIVE

## 2020-02-08 ENCOUNTER — Encounter: Payer: Self-pay | Admitting: Gastroenterology

## 2020-02-09 ENCOUNTER — Other Ambulatory Visit: Payer: Self-pay

## 2020-02-09 ENCOUNTER — Encounter
Admission: RE | Disposition: A | Discharge status: Home / Self Care | Payer: Self-pay | Source: Home / Self Care | Attending: Gastroenterology

## 2020-02-09 ENCOUNTER — Encounter: Payer: Self-pay | Admitting: Gastroenterology

## 2020-02-09 ENCOUNTER — Ambulatory Visit: Payer: BLUE CROSS/BLUE SHIELD | Admitting: Certified Registered Nurse Anesthetist

## 2020-02-09 ENCOUNTER — Ambulatory Visit
Admission: RE | Admit: 2020-02-09 | Discharge: 2020-02-09 | Disposition: A | Discharge status: Home / Self Care | Payer: BLUE CROSS/BLUE SHIELD | Attending: Gastroenterology | Admitting: Gastroenterology

## 2020-02-09 DIAGNOSIS — D122 Benign neoplasm of ascending colon: Secondary | ICD-10-CM | POA: Insufficient documentation

## 2020-02-09 DIAGNOSIS — D125 Benign neoplasm of sigmoid colon: Secondary | ICD-10-CM | POA: Diagnosis not present

## 2020-02-09 DIAGNOSIS — K635 Polyp of colon: Secondary | ICD-10-CM

## 2020-02-09 DIAGNOSIS — Z1211 Encounter for screening for malignant neoplasm of colon: Secondary | ICD-10-CM | POA: Diagnosis not present

## 2020-02-09 DIAGNOSIS — Z888 Allergy status to other drugs, medicaments and biological substances status: Secondary | ICD-10-CM | POA: Diagnosis not present

## 2020-02-09 DIAGNOSIS — D123 Benign neoplasm of transverse colon: Secondary | ICD-10-CM | POA: Diagnosis not present

## 2020-02-09 DIAGNOSIS — D120 Benign neoplasm of cecum: Secondary | ICD-10-CM | POA: Diagnosis not present

## 2020-02-09 DIAGNOSIS — Z20822 Contact with and (suspected) exposure to covid-19: Secondary | ICD-10-CM | POA: Insufficient documentation

## 2020-02-09 DIAGNOSIS — Z87891 Personal history of nicotine dependence: Secondary | ICD-10-CM | POA: Diagnosis not present

## 2020-02-09 DIAGNOSIS — Z9049 Acquired absence of other specified parts of digestive tract: Secondary | ICD-10-CM | POA: Diagnosis not present

## 2020-02-09 DIAGNOSIS — Z79899 Other long term (current) drug therapy: Secondary | ICD-10-CM | POA: Insufficient documentation

## 2020-02-09 HISTORY — PX: COLONOSCOPY WITH PROPOFOL: SHX5780

## 2020-02-09 SURGERY — COLONOSCOPY WITH PROPOFOL
Anesthesia: General

## 2020-02-09 MED ORDER — PROPOFOL 10 MG/ML IV BOLUS
INTRAVENOUS | Status: AC
  Filled 2020-02-09: qty 40

## 2020-02-09 MED ORDER — PROPOFOL 500 MG/50ML IV EMUL
INTRAVENOUS | Status: DC | PRN
  Administered 2020-02-09: 140 ug/kg/min via INTRAVENOUS

## 2020-02-09 MED ORDER — PROPOFOL 500 MG/50ML IV EMUL
INTRAVENOUS | Status: AC
  Filled 2020-02-09: qty 50

## 2020-02-09 MED ORDER — PROPOFOL 10 MG/ML IV BOLUS
INTRAVENOUS | Status: DC | PRN
  Administered 2020-02-09 (×2): 50 mg via INTRAVENOUS

## 2020-02-09 MED ORDER — PROPOFOL 500 MG/50ML IV EMUL
INTRAVENOUS | Status: AC
  Filled 2020-02-09: qty 100

## 2020-02-09 MED ORDER — SODIUM CHLORIDE 0.9 % IV SOLN: INTRAVENOUS | Status: DC

## 2020-02-09 MED ORDER — DEXMEDETOMIDINE (PRECEDEX) IN NS 20 MCG/5ML (4 MCG/ML) IV SYRINGE
PREFILLED_SYRINGE | INTRAVENOUS | Status: AC
  Filled 2020-02-09: qty 5

## 2020-02-09 MED ORDER — DEXMEDETOMIDINE (PRECEDEX) IN NS 20 MCG/5ML (4 MCG/ML) IV SYRINGE
PREFILLED_SYRINGE | INTRAVENOUS | Status: DC | PRN
  Administered 2020-02-09: 8 ug via INTRAVENOUS
  Administered 2020-02-09: 4 ug via INTRAVENOUS

## 2020-02-09 MED ORDER — SEVOFLURANE IN SOLN
RESPIRATORY_TRACT | Status: AC
  Filled 2020-02-09: qty 250

## 2020-02-09 MED ORDER — LIDOCAINE HCL (CARDIAC) PF 100 MG/5ML IV SOSY
PREFILLED_SYRINGE | INTRAVENOUS | Status: DC | PRN
  Administered 2020-02-09: 100 mg via INTRAVENOUS

## 2020-02-09 NOTE — Transfer of Care (Signed)
Immediate Anesthesia Transfer of Care Note  Patient: Bradley Gonzales.  Procedure(s) Performed: COLONOSCOPY WITH PROPOFOL (N/A )  Patient Location: PACU and Endoscopy Unit  Anesthesia Type:General  Level of Consciousness: awake, alert  and oriented  Airway & Oxygen Therapy: Patient Spontanous Breathing  Post-op Assessment: Report given to RN and Post -op Vital signs reviewed and stable  Post vital signs: Reviewed and stable  Last Vitals:  Vitals Value Taken Time  BP 117/83 02/09/20 0900  Temp    Pulse 70 02/09/20 0900  Resp 21 02/09/20 0900  SpO2 97 % 02/09/20 0900  Vitals shown include unvalidated device data.  Last Pain:  Vitals:   02/09/20 0710  TempSrc: Temporal  PainSc: 0-No pain         Complications: No complications documented.

## 2020-02-09 NOTE — H&P (Signed)
Jonathon Bellows, MD [address], [place], [place], [place], [address] [address], [place], [place], [place], [address] Phone: [phone]  Fax: [phone]  Primary Care Physician:  Ria Bush, MD   Pre-Procedure History & Physical: HPI:  Bradley Gonzales. is a 60 y.o. male is here for an colonoscopy.   Past Medical History:  Diagnosis Date  . Dental abscess 11/17/2011  . GERD (gastroesophageal reflux disease)     Past Surgical History:  Procedure Laterality Date  . CHOLECYSTECTOMY  2011    Prior to Admission medications   Medication Sig Start Date End Date Taking? Authorizing Provider  Cyanocobalamin (B-12) 1000 MCG SUBL Place 1 tablet under the tongue daily. 09/16/18  Yes Ria Bush, MD  MISC NATURAL PRODUCTS PO Take by mouth daily. Beet root   Yes [provider]  omeprazole (PRILOSEC) 20 MG capsule Take 1 capsule (20 mg total) by mouth daily. 08/15/18  Yes Ria Bush, MD  sertraline (ZOLOFT) 50 MG tablet Take 1 tablet (50 mg total) by mouth daily. 12/14/19  Yes Ria Bush, MD  thiamine (VITAMIN B-1) 50 MG tablet Take 1 tablet (50 mg total) by mouth daily. 12/12/19  Yes Ria Bush, MD    Allergies as of 12/22/2019 - Review Complete 12/21/2019  Allergen Reaction Noted  . Hydroxyzine Itching 12/14/2019    Family History  Problem Relation Age of Onset  . Hypertension Mother   . Cancer Mother        ovarian  . Cancer Paternal Grandfather        ?lung, smoker  . Heart attack Paternal Grandfather   . Diabetes Paternal Uncle   . Heart failure Maternal Grandfather   . Stroke Neg Hx   . CAD Neg Hx     Social History   Socioeconomic History  . Marital status: Married    Spouse name: Not on file  . Number of children: Not on file  . Years of education: Not on file  . Highest education level: Not on file  Occupational History  . Not on file  Tobacco Use  . Smoking status: Former Smoker    Quit date: 01/12/1994     Years since quitting: 26.0  . Smokeless tobacco: Never Used  Vaping Use  . Vaping Use: Never used  Substance and Sexual Activity  . Alcohol use: Yes    Comment: Occasional  . Drug use: No  . Sexual activity: Not on file  Other Topics Concern  . Not on file  Social History Narrative   Caffeine: pepsi, tea   Lives with wife Baker Janus) and 1 son [name], 1 dog   Occupation: Receiving clerk    Edu: HS   Activity: fishes, walking dog daily 15 min   Diet: good water intake, daily fruits/vegetables   Social Determinants of Health   Financial Resource Strain: Not on file  Food Insecurity: Not on file  Transportation Needs: Not on file  Physical Activity: Not on file  Stress: Not on file  Social Connections: Not on file  Intimate Partner Violence: Not on file    Review of Systems: See HPI, otherwise negative ROS  Physical Exam: BP (!) 158/110   Pulse 78   Temp (!) 97.2 F (36.2 C) (Temporal)   Resp 18   Ht 5\' 11"  (1.803 m)   Wt 81.2 kg   SpO2 98%   BMI 24.97 kg/m  General:   Alert,  pleasant and cooperative in NAD Head:  Normocephalic  and atraumatic. Neck:  Supple; no masses or thyromegaly. Lungs:  Clear throughout to auscultation, normal respiratory effort.    Heart:  +S1, +S2, Regular rate and rhythm, No edema. Abdomen:  Soft, nontender and nondistended. Normal bowel sounds, without guarding, and without rebound.   Neurologic:  Alert and  oriented x4;  grossly normal neurologically.  Impression/Plan: [name] is here for an colonoscopy to be performed for Screening colonoscopy average risk   Risks, benefits, limitations, and alternatives regarding  colonoscopy have been reviewed with the patient.  Questions have been answered.  All parties agreeable.   Jonathon Bellows, MD  02/09/2020, 8:06 AM

## 2020-02-09 NOTE — Op Note (Signed)
[facility] Gastroenterology Patient Name: Bradley Gonzales Procedure Date: 02/09/2020 8:06 AM MRN: [id number] Account #: [id number] Date of Birth: 1960-07-13 Admit Type: Outpatient Age: 60 Room: [facility] ENDO ROOM 1 Gender: Male Note Status: Finalized Procedure:             Colonoscopy Indications:           Screening for colorectal malignant neoplasm Providers:             Jonathon Bellows MD, MD Referring MD:          Ria Bush (Referring MD) Medicines:             Monitored Anesthesia Care Complications:         No immediate complications. Procedure:             Pre-Anesthesia Assessment:                        - Prior to the procedure, a History and Physical was                         performed, and patient medications, allergies and                         sensitivities were reviewed. The patient's tolerance                         of previous anesthesia was reviewed.                        - The risks and benefits of the procedure and the                         sedation options and risks were discussed with the                         patient. All questions were answered and informed                         consent was obtained.                        - ASA Grade Assessment: II - A patient with mild                         systemic disease.                        After obtaining informed consent, the colonoscope was                         passed under direct vision. Throughout the procedure,                         the patient's blood pressure, pulse, and oxygen                         saturations were monitored continuously. The                         Colonoscope was introduced through the anus and  advanced to the the cecum, identified by the                         appendiceal orifice. The colonoscopy was performed                         with ease. The patient tolerated the procedure well.                         The quality of the  bowel preparation was poor. Findings:      The perianal and digital rectal examinations were normal.      A 5 mm polyp was found in the rectum. The polyp was sessile. The polyp       was removed with a cold snare. Resection and retrieval were complete.      A 20 mm polyp was found in the sigmoid colon. The polyp was       pedunculated. The polyp was removed with a hot snare. Resection and       retrieval were complete. To prevent bleeding after the polypectomy, two       hemostatic clips were successfully placed. There was no bleeding at the       end of the procedure.      Two pedunculated polyps were found in the sigmoid colon. The polyps were       15 to 18 mm in size. These polyps were removed with a hot snare.       Resection and retrieval were complete.      Three pedunculated polyps were found in the transverse colon. The polyps       were 12 to 15 mm in size. These polyps were removed with a hot snare.       Resection and retrieval were complete. To prevent bleeding after the       polypectomy, one hemostatic clip was successfully placed. There was no       bleeding during, or at the end, of the procedure.      A 10 mm polyp was found in the cecum. The polyp was sessile. The polyp       was removed with a hot snare. Resection and retrieval were complete.      A 20 mm polyp was found in the cecum. The polyp was semi-sessile.       Preparations were made for mucosal resection. margins marked with NBI       imaging was done to mark the borders of the lesion. sub mucosal       injection with 10 cc saline to provide lift and resection was performed       with a hot snare piecemeal . [Type] was performed. [Resection &       Retrieval]. To prevent bleeding after the polypectomy, two hemostatic       clips were successfully placed. There was no bleeding during, or at the       end, of the procedure.      Two sessile polyps were found in the ascending colon. The polyps were 5       to 7  mm in size. These polyps were removed with a cold snare. Resection       and retrieval were complete.      Two semi-pedunculated polyps were found in the ascending colon. The  polyps were 10 to 14 mm in size. These polyps were removed with a hot       snare. Resection and retrieval were complete. Impression:            - Preparation of the colon was poor.                        - One 5 mm polyp in the rectum, removed with a cold                         snare. Resected and retrieved.                        - One 20 mm polyp in the sigmoid colon, removed with a                         hot snare. Resected and retrieved. Clips were placed.                        - Two 15 to 18 mm polyps in the sigmoid colon, removed                         with a hot snare. Resected and retrieved.                        - Three 12 to 15 mm polyps in the transverse colon,                         removed with a hot snare. Resected and retrieved. Clip                         was placed.                        - One 10 mm polyp in the cecum, removed with a hot                         snare. Resected and retrieved.                        - One 20 mm polyp in the cecum, removed with mucosal                         resection. Resected and retrieved. Clips were placed.                        - Two 5 to 7 mm polyps in the ascending colon, removed                         with a cold snare. Resected and retrieved.                        - Two 10 to 14 mm polyps in the ascending colon,                         removed with a hot snare. Resected and retrieved.                        -  Mucosal resection was performed. Resection was                         complete, and retrieval was complete. Recommendation:        - Discharge patient to home (with escort).                        - Resume previous diet.                        - Continue present medications.                        - Await pathology results.                         - No ibuprofen, naproxen, or other non-steroidal                         anti-inflammatory drugs for 6 weeks after polyp                         removal.                        - Repeat colonoscopy in 3 months because the bowel                         preparation was suboptimal. Procedure Code(s):     --- Professional ---                        [phone], Colonoscopy, flexible; with removal of                         tumor(s), polyp(s), or other lesion(s) by snare                         technique Diagnosis Code(s):     --- Professional ---                        Z12.11, Encounter for screening for malignant neoplasm                         of colon                        K62.1, Rectal polyp                        K63.5, Polyp of colon CPT copyright 2019 American Medical Association. All rights reserved. The codes documented in this report are preliminary and upon coder review may  be revised to meet current compliance requirements. Jonathon Bellows, MD Jonathon Bellows MD, MD 02/09/2020 9:02:57 AM This report has been signed electronically. Number of Addenda: 0 Note Initiated On: 02/09/2020 8:06 AM Scope Withdrawal Time: 0 hours 38 minutes 49 seconds  Total Procedure Duration: 0 hours 41 minutes 50 seconds  Estimated Blood Loss:  Estimated blood loss: none.      [facility]

## 2020-02-09 NOTE — Anesthesia Procedure Notes (Signed)
Procedure Name: MAC Date/Time: 02/09/2020 8:13 AM Performed by: Lily Peer, [employer], CRNA Pre-anesthesia Checklist: Patient identified, Emergency Drugs available, Suction available, Patient being monitored and Timeout performed Patient Re-evaluated:Patient Re-evaluated prior to induction Oxygen Delivery Method: Nasal cannula Induction Type: IV induction

## 2020-02-09 NOTE — Anesthesia Preprocedure Evaluation (Signed)
Anesthesia Evaluation  Patient identified by MRN, date of birth, ID band Patient awake    Reviewed: Allergy & Precautions, H&P , NPO status , Patient's Chart, lab work & pertinent test results, reviewed documented beta blocker date and time   History of Anesthesia Complications Negative for: history of anesthetic complications  Airway Mallampati: II  TM Distance: >3 FB Neck ROM: full    Dental  (+) Dental Advidsory Given, Partial Upper, Partial Lower   Pulmonary neg pulmonary ROS, former smoker,    Pulmonary exam normal breath sounds clear to auscultation       Cardiovascular Exercise Tolerance: Good negative cardio ROS Normal cardiovascular exam Rhythm:regular Rate:Normal     Neuro/Psych negative neurological ROS  negative psych ROS   GI/Hepatic Neg liver ROS, GERD  ,  Endo/Other  negative endocrine ROS  Renal/GU negative Renal ROS  negative genitourinary   Musculoskeletal   Abdominal   Peds  Hematology negative hematology ROS (+)   Anesthesia Other Findings Past Medical History: 11/17/2011: Dental abscess No date: GERD (gastroesophageal reflux disease)   Reproductive/Obstetrics negative OB ROS                             Anesthesia Physical Anesthesia Plan  ASA: II  Anesthesia Plan: General   Post-op Pain Management:    Induction: Intravenous  PONV Risk Score and Plan: 2 and TIVA and Propofol infusion  Airway Management Planned: Natural Airway and Nasal Cannula  Additional Equipment:   Intra-op Plan:   Post-operative Plan:   Informed Consent: I have reviewed the patients History and Physical, chart, labs and discussed the procedure including the risks, benefits and alternatives for the proposed anesthesia with the patient or authorized representative who has indicated his/her understanding and acceptance.     Dental Advisory Given  Plan Discussed with:  Anesthesiologist, CRNA and Surgeon  Anesthesia Plan Comments:         Anesthesia Quick Evaluation

## 2020-02-09 NOTE — Anesthesia Postprocedure Evaluation (Signed)
Anesthesia Post Note  Patient: Bradley Gonzales.  Procedure(s) Performed: COLONOSCOPY WITH PROPOFOL (N/A )  Patient location during evaluation: Endoscopy Anesthesia Type: General Level of consciousness: awake and alert Pain management: pain level controlled Vital Signs Assessment: post-procedure vital signs reviewed and stable Respiratory status: spontaneous breathing, nonlabored ventilation, respiratory function stable and patient connected to nasal cannula oxygen Cardiovascular status: blood pressure returned to baseline and stable Postop Assessment: no apparent nausea or vomiting Anesthetic complications: no   No complications documented.   Last Vitals:  Vitals:   02/09/20 0900 02/09/20 0920  BP: 117/83 (!) 154/90  Pulse:    Resp:    Temp: (!) 36.1 C   SpO2:      Last Pain:  Vitals:   02/09/20 0920  TempSrc:   PainSc: 0-No pain                 Martha Clan

## 2020-02-12 ENCOUNTER — Encounter: Payer: Self-pay | Admitting: Gastroenterology

## 2020-02-12 LAB — SURGICAL PATHOLOGY

## 2020-02-14 ENCOUNTER — Telehealth: Payer: Self-pay

## 2020-02-14 ENCOUNTER — Other Ambulatory Visit: Payer: Self-pay

## 2020-02-14 ENCOUNTER — Encounter: Payer: Self-pay | Admitting: Gastroenterology

## 2020-02-14 DIAGNOSIS — Z86010 Personal history of colonic polyps: Secondary | ICD-10-CM | POA: Insufficient documentation

## 2020-02-14 NOTE — Telephone Encounter (Signed)
-----   Message from Jonathon Bellows, MD sent at 02/14/2020  8:40 AM EST ----- Bradley Gonzales   Inform that 12 out of the 13 polyps were precancerous.  Unusual to have the number of polyps that he had.  1.  Recommend genetic testing at the cancer center to determine if he has a genetic condition that predisposes him to multiple colon polyps  2.  Since his bowel prep was suboptimal smaller polyps may have been missed and I would suggest a repeat colonoscopy in 3 months.  3.  If he has any first-degree relatives over the age of 87, they would need a colonoscopy as well due to family history of colon polyps

## 2020-02-14 NOTE — Progress Notes (Signed)
Per Dr. Vicente Males needs a colonoscopy in 3 months. Pt has been scheduled and instructions will be mailed.

## 2020-02-14 NOTE — Progress Notes (Signed)
Bradley Gonzales   Inform that 12 out of the 13 polyps were precancerous.  Unusual to have the number of polyps that he had.  1.  Recommend genetic testing at the cancer center to determine if he has a genetic condition that predisposes him to multiple colon polyps  2.  Since his bowel prep was suboptimal smaller polyps may have been missed and I would suggest a repeat colonoscopy in 3 months.  3.  If he has any first-degree relatives over the age of 15, they would need a colonoscopy as well due to family history of colon polyps

## 2020-02-14 NOTE — Telephone Encounter (Signed)
Patient has been informed of colonoscopy results. Pt verbalized understanding. Letter will be mailed out explaining result findings.

## 2020-03-02 ENCOUNTER — Encounter: Payer: Self-pay | Admitting: Family Medicine

## 2020-04-24 ENCOUNTER — Telehealth: Payer: Self-pay

## 2020-04-24 NOTE — Telephone Encounter (Signed)
Pt lvm that he would like to cancel his colonoscopy because he did not have his bowel prep rx.  Returned call to him advising that we could have kept him on the schedule and sent bowel prep into the pharmacy.  He stated that this was okay he and his wife had made plans to go to the beach this weekend.  I advised him to call the office back to reschedule when he is ready to do so.  Thanks,  [place], [place]

## 2020-04-26 ENCOUNTER — Encounter: Admission: RE | Payer: Self-pay | Source: Home / Self Care

## 2020-04-26 ENCOUNTER — Ambulatory Visit
Admission: RE | Admit: 2020-04-26 | Payer: BLUE CROSS/BLUE SHIELD | Source: Home / Self Care | Admitting: Gastroenterology

## 2020-04-26 SURGERY — COLONOSCOPY WITH PROPOFOL
Anesthesia: General

## 2020-12-08 ENCOUNTER — Other Ambulatory Visit: Payer: Self-pay | Admitting: Family Medicine

## 2020-12-10 NOTE — Telephone Encounter (Signed)
Tried to reach out to pt to schedule lab/cpe  no answer and no mychart

## 2020-12-10 NOTE — Telephone Encounter (Signed)
Please call patient and schedule an appointment. Send back to CMA for refill after appointment is scheduled.

## 2020-12-11 NOTE — Telephone Encounter (Signed)
E-scribed refill.    Plz try once more to contact pt for CPE appt.  If no response, mail a letter.

## 2020-12-11 NOTE — Telephone Encounter (Signed)
Lvm for pt to call office to get scheduled for lab/cpe

## 2020-12-11 NOTE — Telephone Encounter (Signed)
2nd attempt  Unable to lm to schedule appt

## 2020-12-15 IMAGING — US US ABDOMEN COMPLETE
1 series · 14 of 25 positions shown · non-contrast
Comparison: 08/26/2006

CLINICAL DATA: Transaminitis

EXAM:
ABDOMEN ULTRASOUND COMPLETE

[Series 1: us abdomen complete · 0.26mm/px · 14 of 85 slices shown]
[im 1/85]
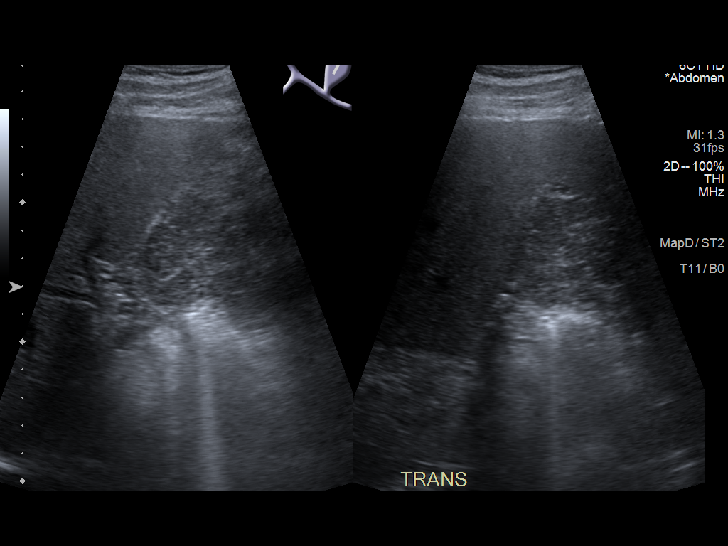
[im 8/85]
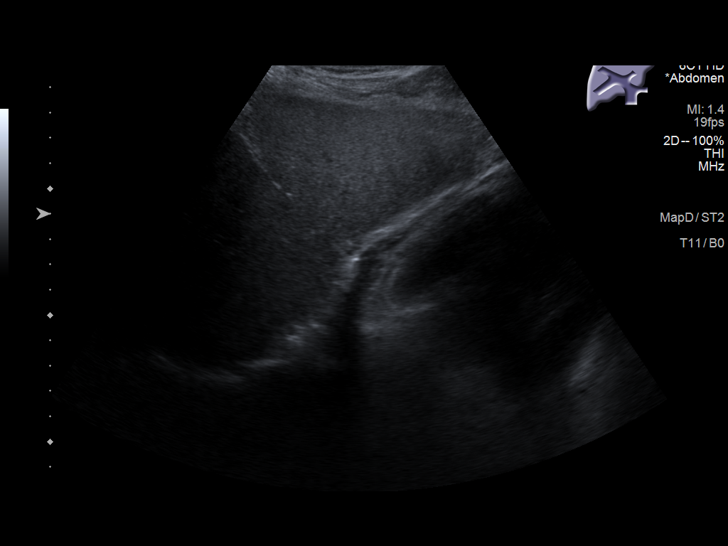
[im 15/85]
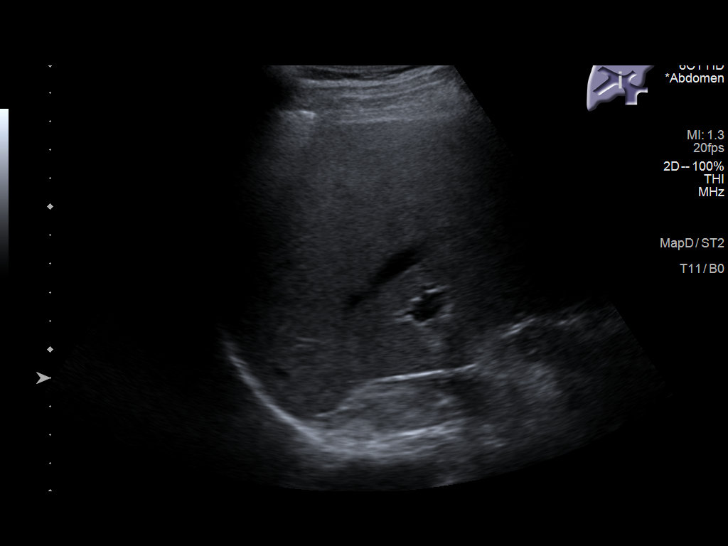
[im 22/85]
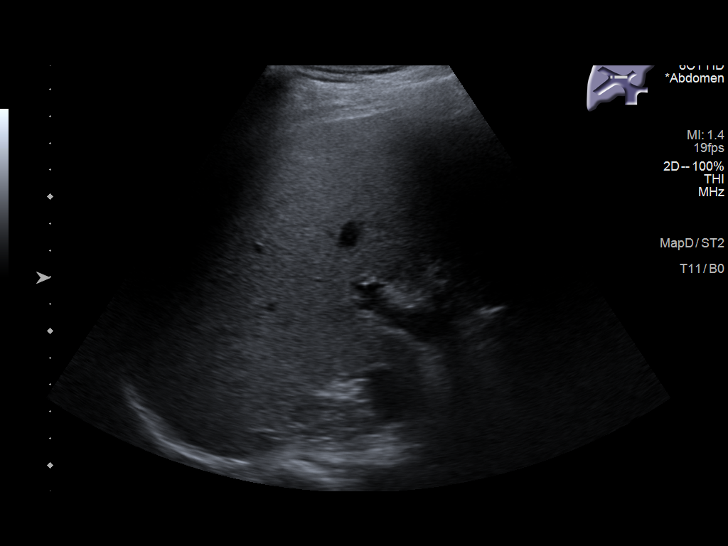
[im 29/85]
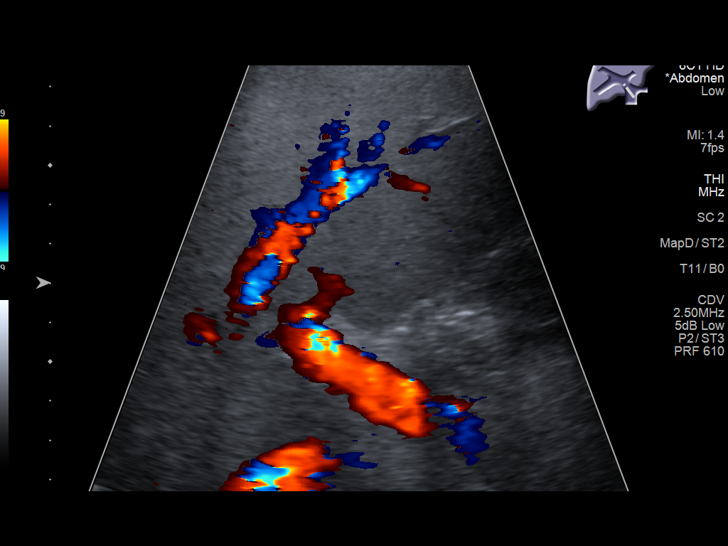
[im 32/85]
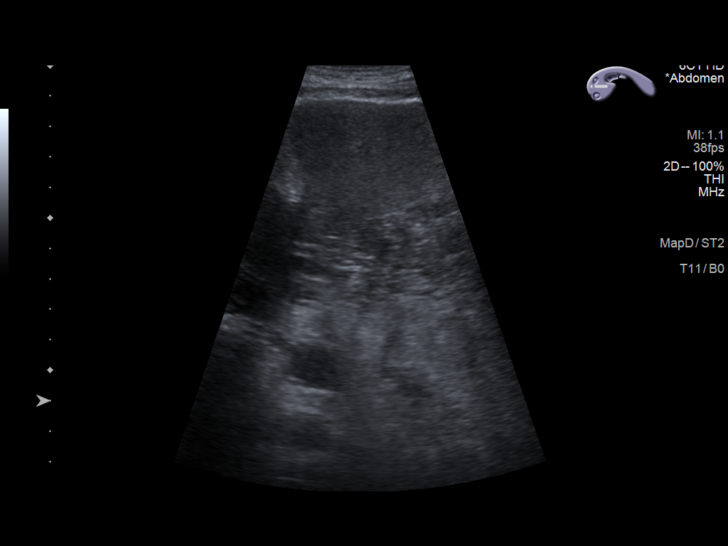
[im 39/85]
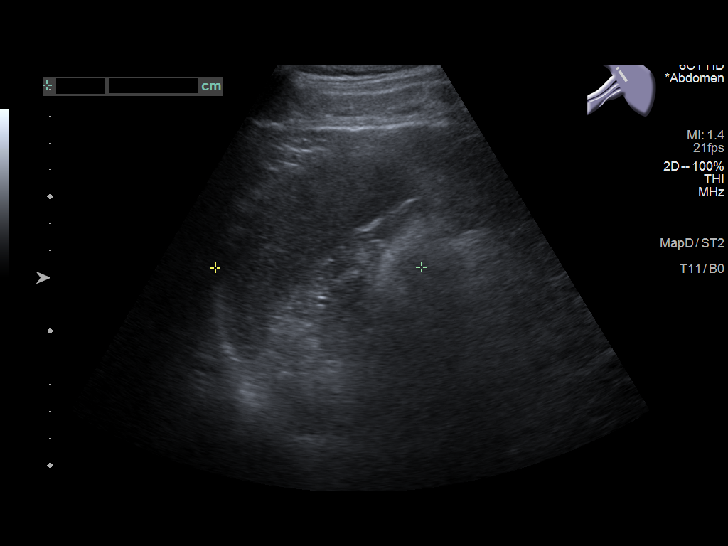
[im 46/85]
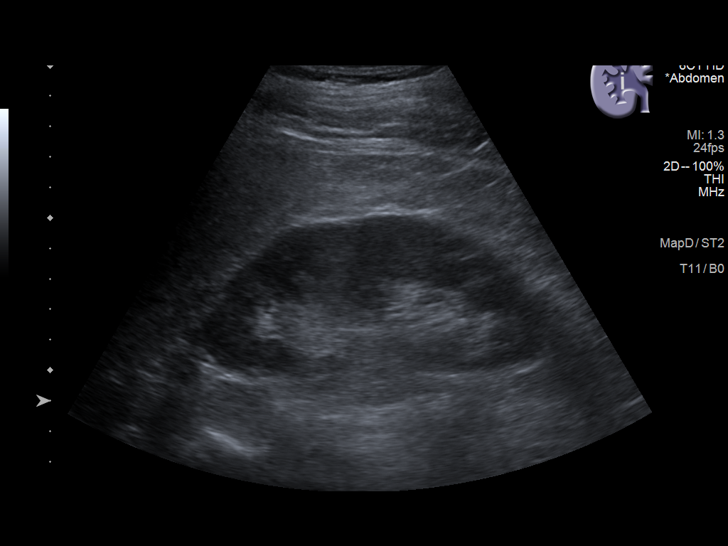
[im 53/85]
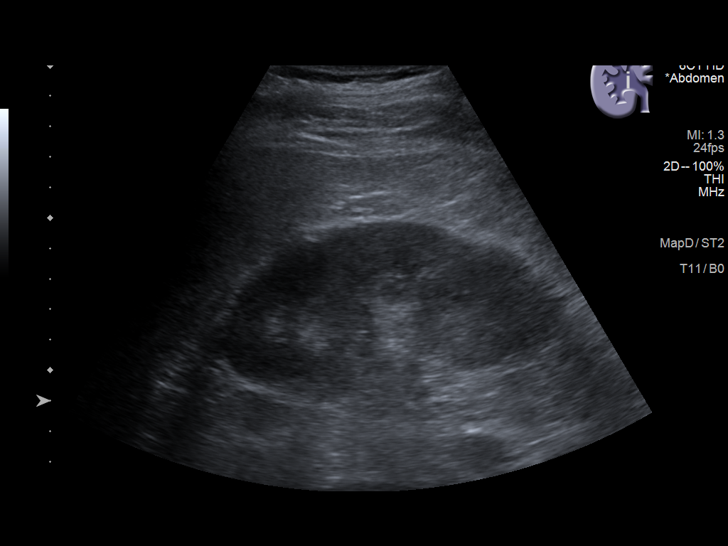
[im 57/85]
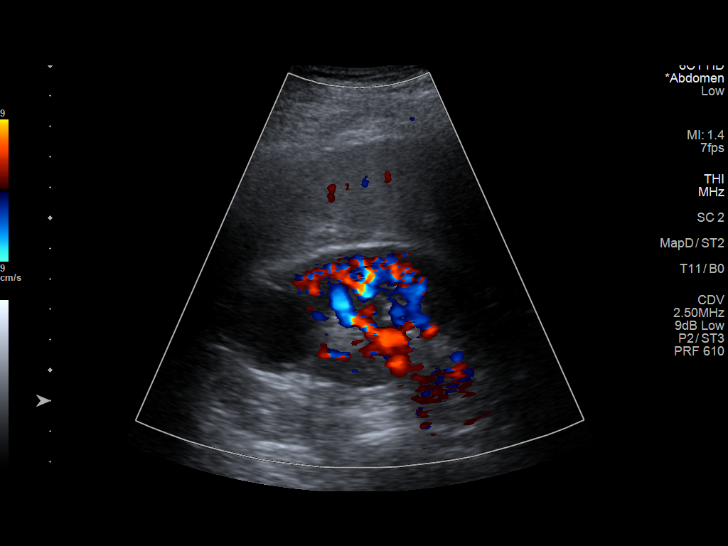
[im 64/85]
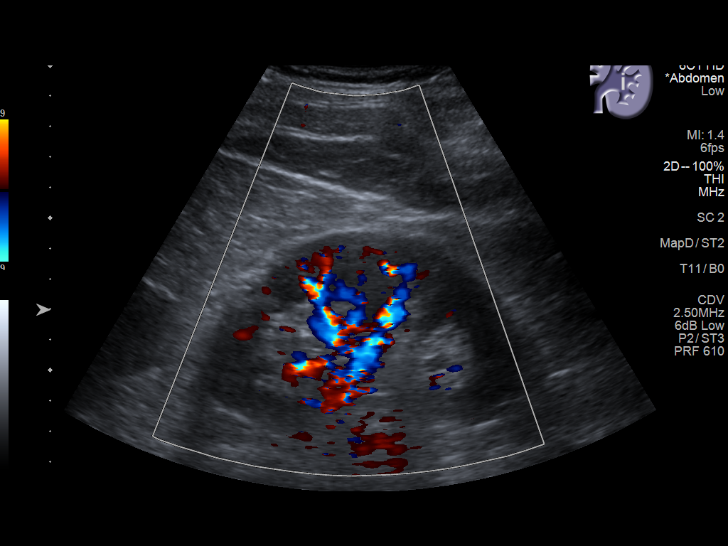
[im 71/85]
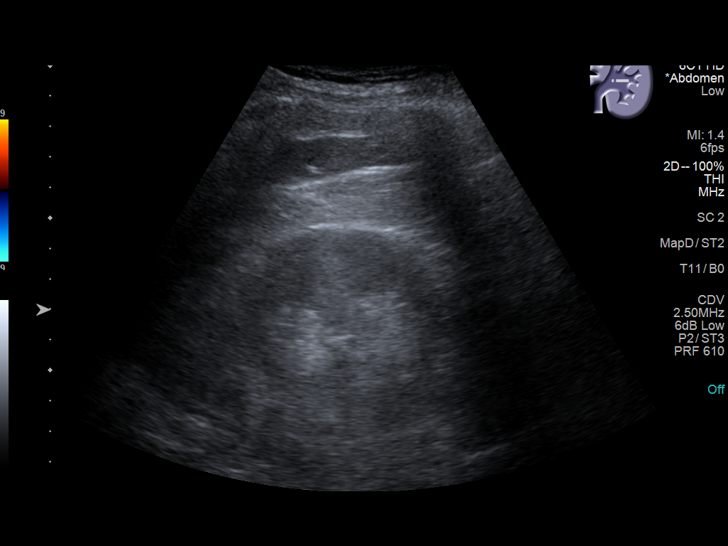
[im 78/85]
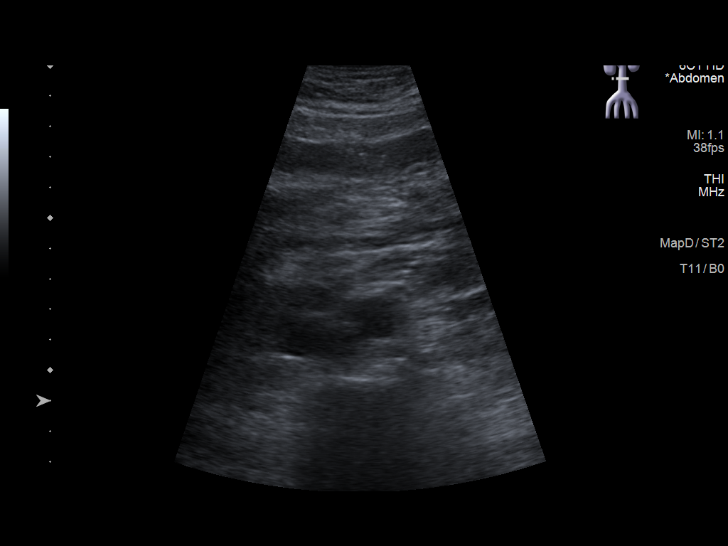
[im 85/85]
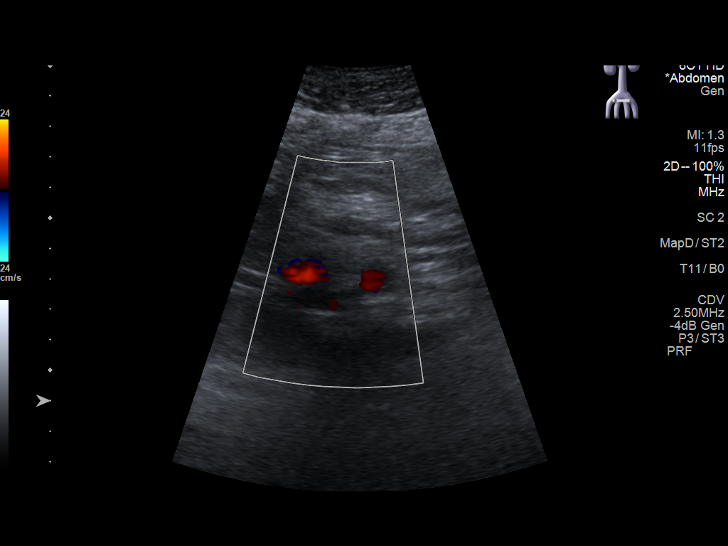

[14 of 25 positions shown; findings below may reference images not displayed]

FINDINGS: Gallbladder: Surgically absent.

Common bile duct: Diameter: 5.6 mm

Liver: No focal lesion identified. Mildly increased hepatic
parenchymal echogenicity. Portal vein is patent on color Doppler
imaging with normal direction of blood flow towards the liver.

IVC: No abnormality visualized.

Pancreas: Visualized portion unremarkable.

Spleen: Size and appearance within normal limits.

Right Kidney: Length: 11.7 cm. Echogenicity within normal limits.
3.2 cm superior pole renal cyst. No solid mass or hydronephrosis
visualized.

Left Kidney: Length: 11.7 cm. Echogenicity within normal limits. No
mass or hydronephrosis visualized.

Abdominal aorta: No aneurysm visualized.

Other findings: None.
IMPRESSION: 1. The echogenicity of the liver is increased. This is a nonspecific
finding but is most commonly seen with fatty infiltration of the
liver. There are no obvious focal liver lesions.
2. Prior cholecystectomy.
3. Simple 3.2 cm right renal cyst.  No obstructive uropathy.

## 2021-03-07 ENCOUNTER — Other Ambulatory Visit: Payer: Self-pay | Admitting: Family Medicine

## 2021-03-09 NOTE — Telephone Encounter (Signed)
Pt is past due for CPE. Please schedule.

## 2021-04-07 ENCOUNTER — Other Ambulatory Visit: Payer: Self-pay | Admitting: Family Medicine

## 2021-04-08 NOTE — Telephone Encounter (Signed)
Pharmacy is requesting 90 day supply. Pt last seen 11/2019 for CPE. Forwarding to Dr Darnell Level to approve or deny  ?

## 2021-04-09 NOTE — Telephone Encounter (Signed)
ERx 

## 2021-07-17 ENCOUNTER — Other Ambulatory Visit: Payer: Self-pay | Admitting: Family Medicine

## 2021-07-17 NOTE — Telephone Encounter (Signed)
Sertraline Last filled:  04/09/21, #90 Last OV:  12/05/19, CPE Next OV:  none  Spoke with pt to schedule CPE and labs.  Pt states he is currently unemployed and cannot afford a doc visit.

## 2021-10-02 ENCOUNTER — Telehealth: Payer: Self-pay | Admitting: Family Medicine

## 2021-10-02 NOTE — Telephone Encounter (Signed)
Bradley Gonzales from [facility] called in and wants to make sure patient still need to take omeprazole (PRILOSEC) 20 MG capsule. His mom gets them for free in bulk and gives them to him. Bradley Gonzales wants to make sure they aren't hurting him by giving them to him because online it say most people take them for 14 days. Please advise. Thank you!

## 2021-10-02 NOTE — Telephone Encounter (Signed)
Rtn Justin's (of [facility] [employer]) call explaining I cannot discuss pt's med info with him.  However, he was able to put pt on the phn.  I spoke with pt confirming, according to last OV notes 12/05/19), pt is to take omeprazole 20 mg cap daily.  Pt verbalizes understanding and provided info to [place].  Also, pt requests a printed copy of current med list.  States his wife, Madaline Savage (on dpr), will pick up today.   [Placed printed med list at front office.]

## 2022-06-17 ENCOUNTER — Ambulatory Visit (INDEPENDENT_AMBULATORY_CARE_PROVIDER_SITE_OTHER): Payer: BLUE CROSS/BLUE SHIELD | Admitting: Family Medicine

## 2022-06-17 ENCOUNTER — Encounter: Payer: Self-pay | Admitting: Family Medicine

## 2022-06-17 ENCOUNTER — Ambulatory Visit (INDEPENDENT_AMBULATORY_CARE_PROVIDER_SITE_OTHER)
Admission: RE | Admit: 2022-06-17 | Discharge: 2022-06-17 | Disposition: A | Discharge status: Home / Self Care | Payer: BLUE CROSS/BLUE SHIELD | Source: Ambulatory Visit | Attending: Family Medicine | Admitting: Family Medicine

## 2022-06-17 VITALS — BP 114/80 | HR 78 | Temp 97.9°F | Ht 71.0 in | Wt 183.0 lb

## 2022-06-17 DIAGNOSIS — N489 Disorder of penis, unspecified: Secondary | ICD-10-CM | POA: Diagnosis not present

## 2022-06-17 DIAGNOSIS — M25532 Pain in left wrist: Secondary | ICD-10-CM

## 2022-06-17 DIAGNOSIS — Z23 Encounter for immunization: Secondary | ICD-10-CM | POA: Diagnosis not present

## 2022-06-17 DIAGNOSIS — E538 Deficiency of other specified B group vitamins: Secondary | ICD-10-CM

## 2022-06-17 DIAGNOSIS — N529 Male erectile dysfunction, unspecified: Secondary | ICD-10-CM | POA: Diagnosis not present

## 2022-06-17 DIAGNOSIS — K219 Gastro-esophageal reflux disease without esophagitis: Secondary | ICD-10-CM

## 2022-06-17 DIAGNOSIS — F418 Other specified anxiety disorders: Secondary | ICD-10-CM

## 2022-06-17 DIAGNOSIS — R351 Nocturia: Secondary | ICD-10-CM

## 2022-06-17 DIAGNOSIS — Z1211 Encounter for screening for malignant neoplasm of colon: Secondary | ICD-10-CM

## 2022-06-17 DIAGNOSIS — F1021 Alcohol dependence, in remission: Secondary | ICD-10-CM

## 2022-06-17 DIAGNOSIS — Z860100 Personal history of colon polyps, unspecified: Secondary | ICD-10-CM

## 2022-06-17 DIAGNOSIS — E519 Thiamine deficiency, unspecified: Secondary | ICD-10-CM

## 2022-06-17 DIAGNOSIS — E785 Hyperlipidemia, unspecified: Secondary | ICD-10-CM

## 2022-06-17 DIAGNOSIS — N401 Enlarged prostate with lower urinary tract symptoms: Secondary | ICD-10-CM

## 2022-06-17 MED ORDER — SILDENAFIL CITRATE 100 MG PO TABS: 50.0000 mg | ORAL_TABLET | Freq: Every day | ORAL | 3 refills | Status: DC | PRN

## 2022-06-17 NOTE — Patient Instructions (Addendum)
Left wrist xray today.  Trial viagra for ED. We will also refer you to urology for evaluation.  Schedule fasting lab visit only at your convenience.  Pass by lab to pick up stool kit. Call [facility] at [phone] to schedule an appointment.

## 2022-06-17 NOTE — Progress Notes (Unsigned)
Ph: [phone] Fax: [phone]   Patient ID: Bradley Gonzales., male    DOB: 21-Nov-1960, 62 y.o.   MRN: [id number]  This visit was conducted in person.  BP 114/80 (BP Location: Left Arm, Patient Position: Sitting, Cuff Size: Large)   Pulse 78   Temp 97.9 F (36.6 C)   Ht 5\' 11"  (1.803 m)   Wt 183 lb (83 kg)   SpO2 99%   BMI 25.52 kg/m    CC: discuss several issues Subjective:   HPI: Bradley Gonzales. is a 62 y.o. male presenting on 06/17/2022 for Erectile Dysfunction (Started sometime last year. )   Last seen 11/2019.   Notes new abnormal bend in penis when erect - started in the past year. Denies pain, inciting trauma. Notes trouble maintaining erection for the past year as well. No ejaculatory issues. He's been using off brand sildenafil through [facility] (OTC).   Denies chest pain, tightness, dyspnea with exertion.   L-handed.  Notes tender persistent swelling to left radial wrist at anatomical snuff box ongoing for 1 year, now worse with job. Also has left upper arm pain worse with raising arm above head. Denies inciting trauma/injury or fall.   Quit drinking almost 1 year ago - sober for 10 months. Completed rehab through [facility] in [place] for 10 months. He's been off sertraline. Discharged on Sunday (graduated).  Back at work at [employer]  Work schedule 7:30am-4pm.   Colonoscopy 01/2020 - 10+ TAs, rpt 3 mo due to poor prep Tobi Bastos) Overdue for f/u - he desires to continue postponing as he just recently restarted work, unable to take time off work for this.  Denies blood in stool, bowel changes, unexpected weight loss.  No fmhx colon cancer or colon polyp      Relevant past medical, surgical, family and social history reviewed and updated as indicated. Interim medical history since our last visit reviewed. Allergies and medications reviewed and updated. Outpatient Medications Prior to Visit  Medication Sig Dispense Refill    MISC NATURAL PRODUCTS PO Take by mouth daily. Beet root     omeprazole (PRILOSEC) 20 MG capsule Take 1 capsule (20 mg total) by mouth daily.     Cyanocobalamin (B-12) 1000 MCG SUBL Place 1 tablet under the tongue daily.     sertraline (ZOLOFT) 50 MG tablet TAKE 1 TABLET BY MOUTH EVERY DAY 90 tablet 1   thiamine (VITAMIN B-1) 50 MG tablet Take 1 tablet (50 mg total) by mouth daily.     No facility-administered medications prior to visit.     Per HPI unless specifically indicated in ROS section below Review of Systems  Objective:  BP 114/80 (BP Location: Left Arm, Patient Position: Sitting, Cuff Size: Large)   Pulse 78   Temp 97.9 F (36.6 C)   Ht 5\' 11"  (1.803 m)   Wt 183 lb (83 kg)   SpO2 99%   BMI 25.52 kg/m   Wt Readings from Last 3 Encounters:  06/17/22 183 lb (83 kg)  02/09/20 179 lb (81.2 kg)  12/05/19 182 lb 2 oz (82.6 kg)      Physical Exam Vitals and nursing note reviewed.  Constitutional:      Appearance: Normal appearance. He is not ill-appearing.  HENT:     Head: Normocephalic and atraumatic.     Mouth/Throat:     Mouth: Mucous membranes are moist.     Pharynx: No oropharyngeal exudate or posterior oropharyngeal erythema.  Eyes:     Extraocular Movements: Extraocular movements intact.     Conjunctiva/sclera: Conjunctivae normal.     Pupils: Pupils are equal, round, and reactive to light.  Neck:     Thyroid: No thyroid mass or thyromegaly.  Cardiovascular:     Rate and Rhythm: Normal rate and regular rhythm.     Pulses: Normal pulses.     Heart sounds: Normal heart sounds. No murmur heard. Pulmonary:     Effort: Pulmonary effort is normal. No respiratory distress.     Breath sounds: Normal breath sounds. No wheezing, rhonchi or rales.  Abdominal:     General: Abdomen is flat. Bowel sounds are normal. There is no distension.     Palpations: Abdomen is soft. There is no mass.     Tenderness: There is no abdominal tenderness. There is no right CVA  tenderness, left CVA tenderness, guarding or rebound.     Hernia: No hernia is present. There is no hernia in the left inguinal area or right inguinal area.  Genitourinary:    Pubic Area: No rash.      Penis: Normal. No tenderness, swelling or lesions.      Testes: Normal.     Epididymis:     Right: Normal.     Left: Normal.     Comments: No abnormality on exam, unable to appreciate bilateral penile arteries, no obvious plaque noted Musculoskeletal:        General: Swelling and tenderness present.     Cervical back: Normal range of motion and neck supple.     Right lower leg: No edema.     Left lower leg: No edema.     Comments:  Tender swelling to left anatomical snuff box No pain at 1st [facility] or rest of wrist Neg finkelstein test Limited flexion/extension with reproducible dorsal L wrist pain   Lymphadenopathy:     Lower Body: No right inguinal adenopathy. No left inguinal adenopathy.  Skin:    General: Skin is warm and dry.     Findings: No rash.  Neurological:     General: No focal deficit present.     Mental Status: He is alert.  Psychiatric:        Mood and Affect: Mood normal.        Behavior: Behavior normal.       Results for orders placed or performed during the hospital encounter of 02/09/20  Surgical pathology  Result Value Ref Range   SURGICAL PATHOLOGY      SURGICAL PATHOLOGY CASE: [id number] PATIENT: Bradley Gonzales Surgical Pathology Report     Specimen Submitted: A. Colon polyps x 6, cecum and ascending; hot snare (4) and cold snare (2, asc.) B. Colon polyp x3, transverse; hot snare C. Colon polyp x3, sigmoid; hot snare D. Rectum polyp; cold snare  Clinical History: Screening colonoscopy, colon polyps     DIAGNOSIS: A.  COLON POLYPS X 6, CECUM (2) AND ASCENDING (4); HOT SNARE (4) AND COLD SNARE (2, ASCENDING COLON): - TUBULAR ADENOMAS, MULTIPLE POLYP FRAGMENTS. - NEGATIVE FOR HIGH-GRADE DYSPLASIA AND MALIGNANCY.  B.  COLON POLYP X 3,  TRANSVERSE; HOT SNARE: - TUBULAR ADENOMAS, 3 POLYPS. - NEGATIVE FOR HIGH-GRADE DYSPLASIA AND MALIGNANCY.  C.  COLON POLYP X 3, SIGMOID; HOT SNARE: - TUBULAR ADENOMA. 3 POLYPS AND SEVERAL SMALLER FRAGMENTS. - NEGATIVE FOR HIGH-GRADE DYSPLASIA AND MALIGNANCY.  D.  RECTUM POLYP; COLD SNARE: - HYPERPLASTIC POLYP. - NEGATIVE FOR DYSPLASIA AND MALIGNANCY.  Comment: Current  multidisciplinary guidelines recommend  consideration of genetic testing for patients with 10 or more adenomatous polyps, cumulative over lifetime.  References 1. Edmonia Caprio, et al. A practice guideline from the [employer] and Genomics and the [employer] Counselors: referral indications for cancer predisposition assessment. Genet Med 2015;17:70-87.  2. Syngal S, Brand RE, Church JM, et al. [facility] clinical guideline: Genetic testing and management of hereditary gastrointestinal cancer syndromes. Am Katheren Puller 2015;110:223-262, quiz 263.  3. Stoffel EM, Mangu PB, Georgeanne Nim, et al. Hereditary colorectal cancer syndromes: American Society of Clinical Oncology Clinical Practice Guideline endorsement of the familial risk colorectal cancer: European Society for Medical Oncology Clinical Practice Guidelines. J Clin Oncol 2015;33:209-217.  GROSS DESCRIPTION: A. Labeled: Hot snared cecal colon polyps x2, asce nding colon polyps 2-cold snared, 2-hot snared (hand-written label) Received: Formalin Collection time: 7:15 AM on 02/09/2020 Placed into formalin time: 7:15 AM on 02/09/2020 Tissue fragment(s): Multiple Size: Aggregate, 2.2 x 1.4 x 0.4 cm Description: Tan soft tissue fragments Entirely submitted in 1 cassette.  B. Labeled: Transverse colon polyp 3-hot snared Received: Formalin Collection time: 8:29 AM on 02/09/2020 Placed into formalin time: 8:29 AM on 02/09/2020 Tissue fragment(s): 3 Size: Aggregate, 1.6 x 1.4 x 0.9 cm Description: Received are  3 fragments of tan-pink soft tissue.  Each fragment has a resection margin which is differentially inked.  The fragments are bisected. Entirely submitted in cassettes 1-2 with the largest bisected fragment in cassette 1 and the 2 smaller fragments in cassette 2.  C. Labeled: Sigmoid colon polyps 3-hot snared Received: Formalin Collection time: 8:49 AM on 02/09/2020 Placed into formalin time: 8:49 AM on 02/09/2020 Tissue fra gment(s): Multiple Size: Aggregate, 2.5 x 1.7 x 1.1 cm Description: Received are fragments of tan-pink soft tissue admixed with a scant amount of fecal matter.  The ratio of soft tissue to fecal matter is 95:5. Three of the fragments have resection margins which are differentially inked.  The fragments are bisected. Entirely submitted in cassettes 1-3 with one bisected fragment in cassette 1, 1 bisected fragment in cassette 2, and one bisected fragment with the remaining fragments in cassette 3.   D. Labeled: Cold snared rectal colon polyp Received: Formalin Collection time: 8:50 AM on 02/09/2020 Placed into formalin time: 8:50 AM on 02/09/2020 Tissue fragment(s): Multiple Size: Aggregate, 0.9 x 0.4 x 0.2 cm Description: Received is a single fragment of tan-pink soft tissue admixed with multiple minute fragments of fecal matter.  The ratio of soft tissue to fecal matter is 95:5. Entirely submitted in 1 cassette.  Final Diagnosis performed by Ronald Lobo, MD.   Electro nically signed 02/12/2020 1:08:47PM The electronic signature indicates that the named Attending Pathologist has evaluated the specimen Technical component performed at [facility], [address], [place], [place] [address] Lab: [phone] Dir: Jolene Schimke, MD, MMM  Professional component performed at [facility], [facility], [address] [place], [place], [place] [address] Lab: [phone] Dir: Georgiann Cocker. Oneita Kras, MD     Assessment & Plan:   Problem List Items Addressed This Visit      GERD (gastroesophageal reflux disease)    Continues daily low dose PPI.       Benign prostatic hyperplasia    Asxs from this standpoint.       Relevant Orders   PSA   HLD (hyperlipidemia)    Update FLP when returns fasting.       Relevant Medications   sildenafil (VIAGRA) 100 MG tablet   Other  Relevant Orders   Lipid panel   Comprehensive metabolic panel   History of alcohol dependence (HCC)    Completed 10 month inpatient rehab program ([facility] in [place]).  Congratulated on completing this program.  Abstinent for the past 10 months.       Relevant Orders   CBC with Differential/Platelet   Depression with anxiety    Stable period off SSRI and off alcohol. Anticipate substance induced mood disorder.       Low serum vitamin B12    Update levels off replacement.       Relevant Orders   Vitamin B12   Thiamine deficiency    Update levels off replacement, off alcohol       Relevant Orders   Vitamin B1   History of colonic polyps    10+ polyps on colonoscopy, poor prep 01/2020 - rec genetic testing, rpt colonoscopy 3 months. This has been delayed - and he requests continued postponement as he just restarted work.  Reviewed reasons to proceed with recommended colonoscopy - declines.  Agrees to iFOB.       Left wrist pain - Primary    L-handed L wrist pain at scaphoid present for the past 1_ yr, denies inciting trauma. Check xray r/o scaphoid fracture.  No obvious dorsal ganglion cyst.  Rec topical voltaren, ice/heat, pending xray consider wrist brace vs hand surgery referral.       Relevant Orders   DG Wrist Complete Left   Erectile dysfunction    Desires trial of Viagra. He can start with 50 mg dose, and increase to 100 mg if necessary. Rec use 1 hour prior to anticipated intercourse. He should not use any more than one whole tablet in a 24 hour period. The side effects of possible headache, flushing have been explained. Advised to seek urgent  medical care if he develops priapism. The patient is not taking nitrates. I have counseled him that taking Viagra with nitrates of any form can cause drops in blood pressure.  Reviewed how ED can be early sign of atherosclerotic disease - denies cardiac symptoms. Update FLP when he returns fasting.       Relevant Orders   Ambulatory referral to Urology   Penile abnormality    Describes abnormal penile curvature without noted trauma or injury.  Discussed suspected peyronie's disease however I was unable to palpate penile arteries on exam. Will refer to urology for further evaluation.       Relevant Orders   Ambulatory referral to Urology   Other Visit Diagnoses     Special screening for malignant neoplasms, colon       Relevant Orders   Fecal occult blood, imunochemical   Need for Tdap vaccination       Relevant Orders   Tdap vaccine greater than or equal to 7yo IM (Completed)        Meds ordered this encounter  Medications   sildenafil (VIAGRA) 100 MG tablet    Sig: Take 0.5-1 tablets (50-100 mg total) by mouth daily as needed for erectile dysfunction.    Dispense:  5 tablet    Refill:  3    Orders Placed This Encounter  Procedures   Fecal occult blood, imunochemical    Standing Status:   Future    Standing Expiration Date:   06/17/2023   DG Wrist Complete Left    Standing Status:   Future    Number of Occurrences:   1    Standing Expiration  Date:   06/17/2023    Order Specific Question:   Reason for Exam (SYMPTOM  OR DIAGNOSIS REQUIRED)    Answer:   left wrist pain at scaphoid x 93yr, no trauam    Order Specific Question:   Preferred imaging location?    Answer:   [facility]   Tdap vaccine greater than or equal to 7yo IM   Lipid panel    Standing Status:   Future    Standing Expiration Date:   06/18/2023   Comprehensive metabolic panel    Standing Status:   Future    Standing Expiration Date:   06/18/2023   PSA    Standing Status:   Future    Standing  Expiration Date:   06/18/2023   Vitamin B12    Standing Status:   Future    Standing Expiration Date:   06/18/2023   Vitamin B1    Standing Status:   Future    Standing Expiration Date:   06/18/2023   CBC with Differential/Platelet    Standing Status:   Future    Standing Expiration Date:   06/18/2023   Ambulatory referral to Urology    Referral Priority:   Routine    Referral Type:   Consultation    Referral Reason:   Specialty Services Required    Requested Specialty:   Urology    Number of Visits Requested:   1    Patient Instructions  Left wrist xray today.  Trial viagra for ED. We will also refer you to urology for evaluation.  Schedule fasting lab visit only at your convenience.  Pass by lab to pick up stool kit. Call [facility] at [phone] to schedule an appointment.   Follow up plan: Return if symptoms worsen or fail to improve.  Eustaquio Boyden, MD

## 2022-06-18 DIAGNOSIS — N529 Male erectile dysfunction, unspecified: Secondary | ICD-10-CM | POA: Insufficient documentation

## 2022-06-18 DIAGNOSIS — N489 Disorder of penis, unspecified: Secondary | ICD-10-CM | POA: Insufficient documentation

## 2022-06-18 DIAGNOSIS — M25531 Pain in right wrist: Secondary | ICD-10-CM | POA: Insufficient documentation

## 2022-06-18 DIAGNOSIS — M25532 Pain in left wrist: Secondary | ICD-10-CM | POA: Insufficient documentation

## 2022-06-18 NOTE — Assessment & Plan Note (Signed)
Asxs from this standpoint.

## 2022-06-18 NOTE — Assessment & Plan Note (Signed)
10+ polyps on colonoscopy, poor prep 01/2020 - rec genetic testing, rpt colonoscopy 3 months. This has been delayed - and he requests continued postponement as he just restarted work.  Reviewed reasons to proceed with recommended colonoscopy - declines.  Agrees to iFOB.

## 2022-06-18 NOTE — Assessment & Plan Note (Signed)
Continues daily low dose PPI.  

## 2022-06-18 NOTE — Assessment & Plan Note (Signed)
Update FLP when returns fasting.  

## 2022-06-18 NOTE — Assessment & Plan Note (Signed)
Describes abnormal penile curvature without noted trauma or injury.  Discussed suspected peyronie's disease however I was unable to palpate penile arteries on exam. Will refer to urology for further evaluation.

## 2022-06-18 NOTE — Assessment & Plan Note (Addendum)
L-handed L wrist pain at scaphoid present for the past 1_ yr, denies inciting trauma. Check xray r/o scaphoid fracture.  No obvious dorsal ganglion cyst.  Rec topical voltaren, ice/heat, pending xray consider wrist brace vs hand surgery referral.

## 2022-06-18 NOTE — Assessment & Plan Note (Addendum)
Desires trial of Viagra. He can start with 50 mg dose, and increase to 100 mg if necessary. Rec use 1 hour prior to anticipated intercourse. He should not use any more than one whole tablet in a 24 hour period. The side effects of possible headache, flushing have been explained. Advised to seek urgent medical care if he develops priapism. The patient is not taking nitrates. I have counseled him that taking Viagra with nitrates of any form can cause drops in blood pressure.  Reviewed how ED can be early sign of atherosclerotic disease - denies cardiac symptoms. Update FLP when he returns fasting.

## 2022-06-18 NOTE — Assessment & Plan Note (Signed)
Stable period off SSRI and off alcohol. Anticipate substance induced mood disorder.

## 2022-06-18 NOTE — Assessment & Plan Note (Signed)
Completed 10 month inpatient rehab program ([facility] in [place]).  Congratulated on completing this program.  Abstinent for the past 10 months.

## 2022-06-18 NOTE — Assessment & Plan Note (Signed)
Update levels off replacement, off alcohol

## 2022-06-18 NOTE — Assessment & Plan Note (Signed)
Update levels off replacement. 

## 2022-06-22 ENCOUNTER — Other Ambulatory Visit (INDEPENDENT_AMBULATORY_CARE_PROVIDER_SITE_OTHER): Payer: BLUE CROSS/BLUE SHIELD

## 2022-06-22 DIAGNOSIS — F1021 Alcohol dependence, in remission: Secondary | ICD-10-CM

## 2022-06-22 DIAGNOSIS — N401 Enlarged prostate with lower urinary tract symptoms: Secondary | ICD-10-CM | POA: Diagnosis not present

## 2022-06-22 DIAGNOSIS — R351 Nocturia: Secondary | ICD-10-CM | POA: Diagnosis not present

## 2022-06-22 DIAGNOSIS — Z1211 Encounter for screening for malignant neoplasm of colon: Secondary | ICD-10-CM

## 2022-06-22 DIAGNOSIS — E785 Hyperlipidemia, unspecified: Secondary | ICD-10-CM | POA: Diagnosis not present

## 2022-06-22 DIAGNOSIS — E538 Deficiency of other specified B group vitamins: Secondary | ICD-10-CM | POA: Diagnosis not present

## 2022-06-22 DIAGNOSIS — E519 Thiamine deficiency, unspecified: Secondary | ICD-10-CM

## 2022-06-22 LAB — COMPREHENSIVE METABOLIC PANEL
ALT: 23 U/L (ref 0–53)
AST: 20 U/L (ref 0–37)
Albumin: 4.5 g/dL (ref 3.5–5.2)
Alkaline Phosphatase: 69 U/L (ref 39–117)
BUN: 15 mg/dL (ref 6–23)
CO2: 29 mEq/L (ref 19–32)
Calcium: 9.3 mg/dL (ref 8.4–10.5)
Chloride: 103 mEq/L (ref 96–112)
Creatinine, Ser: 0.81 mg/dL (ref 0.40–1.50)
GFR: 96.31 mL/min (ref >60.00)
Glucose, Bld: 104 mg/dL — ABNORMAL HIGH (ref 70–99)
Potassium: 4.4 mEq/L (ref 3.5–5.1)
Sodium: 140 mEq/L (ref 135–145)
Total Bilirubin: 0.5 mg/dL (ref 0.2–1.2)
Total Protein: 7.3 g/dL (ref 6.0–8.3)

## 2022-06-22 LAB — CBC WITH DIFFERENTIAL/PLATELET
Basophils Absolute: 0 10*3/uL (ref 0.0–0.1)
Basophils Relative: 0.5 % (ref 0.0–3.0)
Eosinophils Absolute: 0.2 10*3/uL (ref 0.0–0.7)
Eosinophils Relative: 2.5 % (ref 0.0–5.0)
HCT: 38.9 % — ABNORMAL LOW (ref 39.0–52.0)
Hemoglobin: 11.9 g/dL — ABNORMAL LOW (ref 13.0–17.0)
Lymphocytes Relative: 23 % (ref 12.0–46.0)
Lymphs Abs: 2 10*3/uL (ref 0.7–4.0)
MCHC: 30.6 g/dL (ref 30.0–36.0)
MCV: 81.6 fl (ref 78.0–100.0)
Monocytes Absolute: 1.2 10*3/uL — ABNORMAL HIGH (ref 0.1–1.0)
Monocytes Relative: 14.3 % — ABNORMAL HIGH (ref 3.0–12.0)
Neutro Abs: 5.2 10*3/uL (ref 1.4–7.7)
Neutrophils Relative %: 59.7 % (ref 43.0–77.0)
Platelets: 313 10*3/uL (ref 150.0–400.0)
RBC: 4.76 Mil/uL (ref 4.22–5.81)
RDW: 22 % — ABNORMAL HIGH (ref 11.5–15.5)
WBC: 8.7 10*3/uL (ref 4.0–10.5)

## 2022-06-22 LAB — LIPID PANEL
Cholesterol: 161 mg/dL (ref 0–200)
HDL: 46.3 mg/dL (ref >39.00)
LDL Cholesterol: 97 mg/dL (ref 0–99)
NonHDL: 115.17
Total CHOL/HDL Ratio: 3
Triglycerides: 90 mg/dL (ref 0.0–149.0)
VLDL: 18 mg/dL (ref 0.0–40.0)

## 2022-06-22 LAB — PSA: PSA: 0.64 ng/mL (ref 0.10–4.00)

## 2022-06-22 LAB — VITAMIN B12: Vitamin B-12: 237 pg/mL (ref 211–911)

## 2022-06-23 LAB — FECAL OCCULT BLOOD, IMMUNOCHEMICAL: Fecal Occult Bld: NEGATIVE

## 2022-06-25 ENCOUNTER — Other Ambulatory Visit: Payer: Self-pay | Admitting: Family Medicine

## 2022-06-25 DIAGNOSIS — D649 Anemia, unspecified: Secondary | ICD-10-CM | POA: Insufficient documentation

## 2022-06-25 MED ORDER — VITAMIN B-12 1000 MCG PO TABS: 1000.0000 ug | ORAL_TABLET | Freq: Every day | ORAL | Status: AC

## 2022-06-26 LAB — VITAMIN B1: Vitamin B1 (Thiamine): 13 nmol/L (ref 8–30)

## 2022-07-02 ENCOUNTER — Other Ambulatory Visit (INDEPENDENT_AMBULATORY_CARE_PROVIDER_SITE_OTHER): Payer: BLUE CROSS/BLUE SHIELD

## 2022-07-02 DIAGNOSIS — D649 Anemia, unspecified: Secondary | ICD-10-CM | POA: Diagnosis not present

## 2022-07-02 LAB — URINALYSIS, ROUTINE W REFLEX MICROSCOPIC
Bilirubin Urine: NEGATIVE
Hgb urine dipstick: NEGATIVE
Ketones, ur: NEGATIVE
Leukocytes,Ua: NEGATIVE
Nitrite: NEGATIVE
Specific Gravity, Urine: >=1.03 — AB (ref 1.000–1.030)
Total Protein, Urine: NEGATIVE
Urine Glucose: NEGATIVE
Urobilinogen, UA: 0.2 (ref 0.0–1.0)
pH: 5.5 (ref 5.0–8.0)

## 2022-07-10 ENCOUNTER — Telehealth: Payer: Self-pay | Admitting: Family Medicine

## 2022-07-10 NOTE — Telephone Encounter (Signed)
Did not see any results on lab. Did we need to give any information to patient regarding this?

## 2022-07-10 NOTE — Telephone Encounter (Signed)
Too soon for refill. Rx sent on 06/17/22, #5 with 4 additional refills to [facility].   Plz schedule CPE and fasting lab (no food/drink- except water and/or blk coffee 5 hrs prior) visits for additional refills.

## 2022-07-10 NOTE — Telephone Encounter (Signed)
Told pt, Lisa's response. Scheduled cpe for 11/17/22. Pt asked was the results from his urine sample reviewed? Call back # [phone]

## 2022-07-10 NOTE — Telephone Encounter (Signed)
Prescription Request  07/10/2022  LOV: 06/17/2022  What is the name of the medication or equipment? sildenafil (VIAGRA) 100 MG tablet [[id number]]   Have you contacted your pharmacy to request a refill? No   Which pharmacy would you like this sent to?    [id number] Judithann Sheen, [place] - [address] [place] [address] Phone: [phone] Fax: [phone]    Patient notified that their request is being sent to the clinical staff for review and that they should receive a response within 2 business days.   Please advise at Mobile [phone] (mobile)  Pt also asked for the results from his urine sample?

## 2022-07-10 NOTE — Telephone Encounter (Signed)
Plz notify urine returned concentrated but not signs of blood or infection. Increase water intake as able.  Was viagra effective? If so, how many will insurance cover per month?

## 2022-07-13 MED ORDER — SILDENAFIL CITRATE 100 MG PO TABS: 50.0000 mg | ORAL_TABLET | Freq: Every day | ORAL | 6 refills | Status: DC | PRN

## 2022-07-13 NOTE — Addendum Note (Signed)
Addended by: Eustaquio Boyden on: 07/13/2022 01:55 PM   Modules accepted: Orders

## 2022-07-13 NOTE — Telephone Encounter (Signed)
I'll send in #10/month of viagra to try this way.

## 2022-07-13 NOTE — Telephone Encounter (Signed)
Pt called back returning Lisa's call. Told pt Dr. Timoteo Expose response. Pt had no questions/concerns. Pt stated he doesn't know how many his insurance will cover per month, pt stated he believed they'd cover the 30 tablets per month. Call back # [phone]

## 2022-07-13 NOTE — Telephone Encounter (Signed)
Spoke with pt relaying Dr. G's message. Pt expresses his thanks.  

## 2022-07-13 NOTE — Telephone Encounter (Signed)
Attempted to contact pt. No answer. No vm. Need to relay Dr. Timoteo Expose message and get answers to his questions.

## 2022-11-09 ENCOUNTER — Other Ambulatory Visit: Payer: Self-pay | Admitting: Family Medicine

## 2022-11-09 DIAGNOSIS — E538 Deficiency of other specified B group vitamins: Secondary | ICD-10-CM

## 2022-11-09 DIAGNOSIS — E785 Hyperlipidemia, unspecified: Secondary | ICD-10-CM

## 2022-11-09 DIAGNOSIS — E519 Thiamine deficiency, unspecified: Secondary | ICD-10-CM

## 2022-11-09 DIAGNOSIS — N401 Enlarged prostate with lower urinary tract symptoms: Secondary | ICD-10-CM

## 2022-11-10 ENCOUNTER — Other Ambulatory Visit (INDEPENDENT_AMBULATORY_CARE_PROVIDER_SITE_OTHER): Payer: BLUE CROSS/BLUE SHIELD

## 2022-11-10 DIAGNOSIS — E538 Deficiency of other specified B group vitamins: Secondary | ICD-10-CM | POA: Diagnosis not present

## 2022-11-10 DIAGNOSIS — E519 Thiamine deficiency, unspecified: Secondary | ICD-10-CM

## 2022-11-10 DIAGNOSIS — R351 Nocturia: Secondary | ICD-10-CM

## 2022-11-10 DIAGNOSIS — E785 Hyperlipidemia, unspecified: Secondary | ICD-10-CM | POA: Diagnosis not present

## 2022-11-10 DIAGNOSIS — N401 Enlarged prostate with lower urinary tract symptoms: Secondary | ICD-10-CM

## 2022-11-10 LAB — COMPREHENSIVE METABOLIC PANEL
ALT: 34 U/L (ref 0–53)
AST: 24 U/L (ref 0–37)
Albumin: 4.4 g/dL (ref 3.5–5.2)
Alkaline Phosphatase: 65 U/L (ref 39–117)
BUN: 17 mg/dL (ref 6–23)
CO2: 30 meq/L (ref 19–32)
Calcium: 9.3 mg/dL (ref 8.4–10.5)
Chloride: 105 meq/L (ref 96–112)
Creatinine, Ser: 0.89 mg/dL (ref 0.40–1.50)
GFR: 93.35 mL/min (ref >60.00)
Glucose, Bld: 100 mg/dL — ABNORMAL HIGH (ref 70–99)
Potassium: 4.5 meq/L (ref 3.5–5.1)
Sodium: 143 meq/L (ref 135–145)
Total Bilirubin: 0.4 mg/dL (ref 0.2–1.2)
Total Protein: 7.3 g/dL (ref 6.0–8.3)

## 2022-11-10 LAB — CBC WITH DIFFERENTIAL/PLATELET
Basophils Absolute: 0.1 10*3/uL (ref 0.0–0.1)
Basophils Relative: 0.7 % (ref 0.0–3.0)
Eosinophils Absolute: 0.2 10*3/uL (ref 0.0–0.7)
Eosinophils Relative: 2.2 % (ref 0.0–5.0)
HCT: 40.4 % (ref 39.0–52.0)
Hemoglobin: 12.1 g/dL — ABNORMAL LOW (ref 13.0–17.0)
Lymphocytes Relative: 26.3 % (ref 12.0–46.0)
Lymphs Abs: 2.5 10*3/uL (ref 0.7–4.0)
MCHC: 29.8 g/dL — ABNORMAL LOW (ref 30.0–36.0)
MCV: 84.1 fL (ref 78.0–100.0)
Monocytes Absolute: 1.4 10*3/uL — ABNORMAL HIGH (ref 0.1–1.0)
Monocytes Relative: 14.3 % — ABNORMAL HIGH (ref 3.0–12.0)
Neutro Abs: 5.4 10*3/uL (ref 1.4–7.7)
Neutrophils Relative %: 56.5 % (ref 43.0–77.0)
Platelets: 308 10*3/uL (ref 150.0–400.0)
RBC: 4.81 Mil/uL (ref 4.22–5.81)
RDW: 20.4 % — ABNORMAL HIGH (ref 11.5–15.5)
WBC: 9.5 10*3/uL (ref 4.0–10.5)

## 2022-11-10 LAB — LIPID PANEL
Cholesterol: 184 mg/dL (ref 0–200)
HDL: 42.9 mg/dL (ref >39.00)
LDL Cholesterol: 104 mg/dL — ABNORMAL HIGH (ref 0–99)
NonHDL: 140.91
Total CHOL/HDL Ratio: 4
Triglycerides: 184 mg/dL — ABNORMAL HIGH (ref 0.0–149.0)
VLDL: 36.8 mg/dL (ref 0.0–40.0)

## 2022-11-10 LAB — VITAMIN B12: Vitamin B-12: 522 pg/mL (ref 211–911)

## 2022-11-10 LAB — PSA: PSA: 0.6 ng/mL (ref 0.10–4.00)

## 2022-11-13 LAB — VITAMIN B1: Vitamin B1 (Thiamine): 9 nmol/L (ref 8–30)

## 2022-11-17 ENCOUNTER — Ambulatory Visit (INDEPENDENT_AMBULATORY_CARE_PROVIDER_SITE_OTHER): Payer: BLUE CROSS/BLUE SHIELD | Admitting: Family Medicine

## 2022-11-17 ENCOUNTER — Encounter: Payer: Self-pay | Admitting: Family Medicine

## 2022-11-17 VITALS — BP 126/84 | HR 70 | Temp 97.9°F | Ht 69.0 in | Wt 188.1 lb

## 2022-11-17 DIAGNOSIS — Z860100 Personal history of colon polyps, unspecified: Secondary | ICD-10-CM

## 2022-11-17 DIAGNOSIS — E519 Thiamine deficiency, unspecified: Secondary | ICD-10-CM

## 2022-11-17 DIAGNOSIS — K219 Gastro-esophageal reflux disease without esophagitis: Secondary | ICD-10-CM

## 2022-11-17 DIAGNOSIS — F1021 Alcohol dependence, in remission: Secondary | ICD-10-CM

## 2022-11-17 DIAGNOSIS — N529 Male erectile dysfunction, unspecified: Secondary | ICD-10-CM | POA: Diagnosis not present

## 2022-11-17 DIAGNOSIS — N489 Disorder of penis, unspecified: Secondary | ICD-10-CM | POA: Diagnosis not present

## 2022-11-17 DIAGNOSIS — M25531 Pain in right wrist: Secondary | ICD-10-CM

## 2022-11-17 DIAGNOSIS — R351 Nocturia: Secondary | ICD-10-CM

## 2022-11-17 DIAGNOSIS — E785 Hyperlipidemia, unspecified: Secondary | ICD-10-CM

## 2022-11-17 DIAGNOSIS — Z0000 Encounter for general adult medical examination without abnormal findings: Secondary | ICD-10-CM

## 2022-11-17 DIAGNOSIS — M25532 Pain in left wrist: Secondary | ICD-10-CM

## 2022-11-17 DIAGNOSIS — D649 Anemia, unspecified: Secondary | ICD-10-CM

## 2022-11-17 DIAGNOSIS — F10982 Alcohol use, unspecified with alcohol-induced sleep disorder: Secondary | ICD-10-CM

## 2022-11-17 DIAGNOSIS — E538 Deficiency of other specified B group vitamins: Secondary | ICD-10-CM

## 2022-11-17 DIAGNOSIS — N401 Enlarged prostate with lower urinary tract symptoms: Secondary | ICD-10-CM

## 2022-11-17 MED ORDER — DICLOFENAC SODIUM 1 % EX GEL: 2.0000 g | Freq: Three times a day (TID) | CUTANEOUS | 3 refills | Status: AC

## 2022-11-17 MED ORDER — SILDENAFIL CITRATE 100 MG PO TABS: 100.0000 mg | ORAL_TABLET | Freq: Every day | ORAL | 11 refills | Status: AC | PRN

## 2022-11-17 MED ORDER — OMEPRAZOLE 20 MG PO CPDR: 20.0000 mg | DELAYED_RELEASE_CAPSULE | Freq: Every day | ORAL | 4 refills | Status: AC

## 2022-11-17 NOTE — Assessment & Plan Note (Signed)
Slowly improving.  Update iron panel next labs.  Pending repeat colonoscopy.

## 2022-11-17 NOTE — Assessment & Plan Note (Addendum)
Requests uro referral to [facility].  From prior note: Describes abnormal penile curvature without noted trauma or injury.  Discussed suspected peyronie's disease however I was unable to palpate penile arteries on exam. Will refer to urology for further evaluation.

## 2022-11-17 NOTE — Progress Notes (Signed)
Ph: [phone] Fax: [phone]   Patient ID: Bradley Gonzales., male    DOB: 03-24-1960, 62 y.o.   MRN: [id number]  This visit was conducted in person.  BP 126/84   Pulse 70   Temp 97.9 F (36.6 C) (Oral)   Ht 5\' 9"  (1.753 m)   Wt 188 lb 2 oz (85.3 kg)   SpO2 99%   BMI 27.78 kg/m    CC: CPE Subjective:   HPI: Bradley Gonzales. is a 62 y.o. male presenting on 11/17/2022 for Annual Exam   Prefers [place] for all referrals.   He is L handed.  Ongoing L wrist dorsal ulnar pain/swelling for the past 5 months. Now notes bilateral wrist pain but he notes job is repetitive. Entire hand will go numb.  Ongoing pain that started when he left rehab.  Initially was able to play basketball at rehab, but suddenly pain developed without inciting trauma/injury/falls.  He's been managing with BC arthritis with benefit.  Wrist has never gotten red, hot, swollen.   LEFT WRIST - COMPLETE 4 VIEW COMPARISON:  None Available. FINDINGS: No fracture, dislocation or subluxation. 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 No osteolytic or osteoblastic lesions identified.  IMPRESSION: Radiocarpal degenerative changes.  No acute osseous abnormalities. Electronically Signed   By: Layla Maw M.D.   On: 06/23/2022 18:53  Viagra has helped.   Quit drinking >1 year ago - sober for 15 months. Completed rehab through [facility] in [place] for 10 months.   Preventative: Colonoscopy 01/2020 - 10+ TAs, rpt 3 mo due to poor prep Tobi Bastos) Overdue for f/u - he desires to continue postponing as he just recently restarted work, unable to take time off work for this.  iFOB normal 06/2022.  Requests new referral today.  Prostate cancer screening - Nocturia x2. PSA reassuring. Taking OTC supplement with benefit - will let me know what this is.  Flu shot - declines  Tetanus 2011, Tdap 06/2022 COVID vaccine - discussed, declines  Pneumovax - not  due Shingrix - declines  Seat belt discussed  Sunscreen use discussed, no changing moles on skin. Notes dry skin Sleep - averaging 8 hours/night Non smoker.  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 Interim medical history since our last visit reviewed. Allergies and medications reviewed and updated. Outpatient Medications Prior to Visit  Medication Sig Dispense Refill   cyanocobalamin (VITAMIN B12) 1000 MCG tablet Take 1 tablet (1,000 mcg total) by mouth daily.     MISC NATURAL PRODUCTS PO Take by mouth daily. Beet root     omeprazole (PRILOSEC) 20 MG capsule Take 1 capsule (20 mg total) by mouth daily.     sildenafil (VIAGRA) 100 MG tablet Take 0.5-1 tablets (50-100 mg total) by mouth daily as needed for erectile dysfunction. 10 tablet 6   No facility-administered medications prior to visit.     Per HPI unless specifically indicated in ROS section below Review of Systems  Constitutional:  Negative for activity change, appetite change, chills, fatigue, fever and unexpected weight change.  HENT:  Negative for hearing  loss.   Eyes:  Negative for visual disturbance.  Respiratory:  Positive for wheezing (with bending over). Negative for cough, chest tightness and shortness of breath.   Cardiovascular:  Negative for chest pain, palpitations and leg swelling.  Gastrointestinal:  Negative for abdominal distention, abdominal pain, blood in stool, constipation, diarrhea, nausea and vomiting.  Genitourinary:  Negative for difficulty urinating and hematuria.  Musculoskeletal:  Negative for arthralgias, myalgias and neck  pain.  Skin:  Negative for rash.  Neurological:  Negative for dizziness, seizures, syncope and headaches.  Hematological:  Negative for adenopathy. Does not bruise/bleed easily.  Psychiatric/Behavioral:  Negative for dysphoric mood. The patient is not nervous/anxious.     Objective:  BP 126/84   Pulse 70   Temp 97.9 F (36.6 C) (Oral)   Ht 5\' 9"  (1.753 m)   Wt 188 lb 2 oz (85.3 kg)   SpO2 99%   BMI 27.78 kg/m   Wt Readings from Last 3 Encounters:  11/17/22 188 lb 2 oz (85.3 kg)  06/17/22 183 lb (83 kg)  02/09/20 179 lb (81.2 kg)      Physical Exam Vitals and nursing note reviewed.  Constitutional:      General: He is not in acute distress.    Appearance: Normal appearance. He is well-developed. He is not ill-appearing.  HENT:     Head: Normocephalic and atraumatic.     Right Ear: Hearing, tympanic membrane, ear canal and external ear normal.     Left Ear: Hearing, tympanic membrane, ear canal and external ear normal.     Nose: Nose normal.     Mouth/Throat:     Mouth: Mucous membranes are moist.     Pharynx: Oropharynx is clear. No oropharyngeal exudate or posterior oropharyngeal erythema.  Eyes:     General: No scleral icterus.    Extraocular Movements: Extraocular movements intact.     Conjunctiva/sclera: Conjunctivae normal.     Pupils: Pupils are equal, round, and reactive to light.  Neck:     Thyroid: No thyroid mass or thyromegaly.     Vascular: No carotid bruit.  Cardiovascular:     Rate and Rhythm: Normal rate and regular rhythm.     Pulses: Normal pulses.          Radial pulses are 2+ on the right side and 2+ on the left side.     Heart sounds: Normal heart sounds. No murmur heard. Pulmonary:     Effort: Pulmonary effort is normal. No respiratory distress.     Breath sounds: Normal breath sounds. No wheezing, rhonchi or rales.  Abdominal:     General: Bowel sounds are normal. There is no distension.     Palpations: Abdomen is soft. There is no mass.      Tenderness: There is no abdominal tenderness. There is no guarding or rebound.     Hernia: No hernia is present.  Musculoskeletal:        General: Tenderness present.     Cervical back: Normal range of motion and neck supple.     Right lower leg: No edema.     Left lower leg: No edema.     Comments:  Limited ROM to left wrist  Tender to palpation radial wrists L>R Tender to palpation of 1st CMC bilaterally  Neg finkelstein test bilaterally No pain at scaphoid Neg tinel/phalen Triggering of right thumb at 1st MCPJ  Lymphadenopathy:     Cervical: No cervical adenopathy.  Skin:    General: Skin is  warm and dry.     Findings: No erythema or rash.  Neurological:     General: No focal deficit present.     Mental Status: He is alert and oriented to person, place, and time.  Psychiatric:        Mood and Affect: Mood normal.        Behavior: Behavior normal.        Thought Content: Thought content normal.        Judgment: Judgment normal.       Results for orders placed or performed in visit on 11/10/22  CBC with Differential/Platelet  Result Value Ref Range   WBC 9.5 4.0 - 10.5 K/uL   RBC 4.81 4.22 - 5.81 Mil/uL   Hemoglobin 12.1 (L) 13.0 - 17.0 g/dL   HCT 16.1 09.6 - 04.5 %   MCV 84.1 78.0 - 100.0 fl   MCHC 29.8 Repeated and verified X2. (L) 30.0 - 36.0 g/dL   RDW 40.9 (H) 81.1 - 91.4 %   Platelets 308.0 150.0 - 400.0 K/uL   Neutrophils Relative % 56.5 43.0 - 77.0 %   Lymphocytes Relative 26.3 12.0 - 46.0 %   Monocytes Relative 14.3 (H) 3.0 - 12.0 %   Eosinophils Relative 2.2 0.0 - 5.0 %   Basophils Relative 0.7 0.0 - 3.0 %   Neutro Abs 5.4 1.4 - 7.7 K/uL   Lymphs Abs 2.5 0.7 - 4.0 K/uL   Monocytes Absolute 1.4 (H) 0.1 - 1.0 K/uL   Eosinophils Absolute 0.2 0.0 - 0.7 K/uL   Basophils Absolute 0.1 0.0 - 0.1 K/uL  Vitamin B1  Result Value Ref Range   Vitamin B1 (Thiamine) 9 8 - 30 nmol/L  Vitamin B12  Result Value Ref Range   Vitamin B-12 522 211 - 911 pg/mL  PSA   Result Value Ref Range   PSA 0.60 0.10 - 4.00 ng/mL  Comprehensive metabolic panel  Result Value Ref Range   Sodium 143 135 - 145 mEq/L   Potassium 4.5 3.5 - 5.1 mEq/L   Chloride 105 96 - 112 mEq/L   CO2 30 19 - 32 mEq/L   Glucose, Bld 100 (H) 70 - 99 mg/dL   BUN 17 6 - 23 mg/dL   Creatinine, Ser 7.82 0.40 - 1.50 mg/dL   Total Bilirubin 0.4 0.2 - 1.2 mg/dL   Alkaline Phosphatase 65 39 - 117 U/L   AST 24 0 - 37 U/L   ALT 34 0 - 53 U/L   Total Protein 7.3 6.0 - 8.3 g/dL   Albumin 4.4 3.5 - 5.2 g/dL   GFR 95.62 >13.08 mL/min   Calcium 9.3 8.4 - 10.5 mg/dL  Lipid panel  Result Value Ref Range   Cholesterol 184 0 - 200 mg/dL   Triglycerides 657.8 (H) 0.0 - 149.0 mg/dL   HDL 46.96 >29.52 mg/dL   VLDL 84.1 0.0 - 32.4 mg/dL   LDL Cholesterol 401 (H) 0 - 99 mg/dL   Total CHOL/HDL Ratio 4    NonHDL 140.91    No results found for: "IRON", "TIBC", "FERRITIN"   Assessment & Plan:   Problem List Items Addressed This Visit     Healthcare maintenance - Primary (Chronic)    Preventative protocols reviewed and updated unless pt declined. Discussed healthy diet and lifestyle.       GERD (gastroesophageal reflux disease)    Chronic, stable on daily low dose omeprazole - discussed trying every other day dosing.       Relevant  Medications   omeprazole (PRILOSEC) 20 MG capsule   Benign prostatic hyperplasia    Notes improved since taking over-the-counter supplement-will let me know what it is.      HLD (hyperlipidemia)    Reviewed recent cholesterol levels, as well as diet choices to improve control. The 10-year ASCVD risk score (Arnett DK, et al., 2019) is: 8.7%   Values used to calculate the score:     Age: 55 years     Sex: Male     Is Non-Hispanic African American: No     Diabetic: No     Tobacco smoker: No     Systolic Blood Pressure: 126 mmHg     Is BP treated: No     HDL Cholesterol: 42.9 mg/dL     Total Cholesterol: 184 mg/dL      Relevant Medications   sildenafil  (VIAGRA) 100 MG tablet   History of alcohol dependence (HCC)    Abstinent since August 2023.  Completed intensive 1-month inpatient rehab summer 2024. Congratulated on continued abstinence.      RESOLVED: Insomnia    This has resolved since he stopped drinking.  Will resolve from problem list      Low serum vitamin B12    Stable period on vitamin B12 1000 mcg daily.      RESOLVED: Thiamine deficiency    Levels normal off replacement - this has resolved with alcohol cessation - will resolve.      History of colonic polyps    10+ polyps on colonoscopy 2022.  Overdue for repeat colonoscopy.  Will refer back to [facility] per patient request.      Relevant Orders   Ambulatory referral to Gastroenterology   Bilateral wrist pain    Left handed. Bilateral wrist pain present for at least 6 months, more predominant on the left with associated decreased range of motion. X-rays from summer 2024 without obvious wrist fracture but did show significant radiocarpal osteoarthritis changes.  He has been managing with BC powders and topical Voltaren with limited benefit. He also has evidence of right trigger thumb. Discussed options including advanced imaging with CT of wrists, versus further evaluation by sports medicine.  He will schedule sports medicine appointment for further evaluation.      Erectile dysfunction    Stable period, tolerating viagra well but could be more effective, will increase dose to full 100mg  tablet.       Relevant Orders   Ambulatory referral to Urology   Penile abnormality    Requests uro referral to [facility].  From prior note: Describes abnormal penile curvature without noted trauma or injury.  Discussed suspected peyronie's disease however I was unable to palpate penile arteries on exam. Will refer to urology for further evaluation.       Relevant Orders   Ambulatory referral to Urology   Anemia    Slowly improving.  Update iron panel next labs.  Pending  repeat colonoscopy.        Meds ordered this encounter  Medications   omeprazole (PRILOSEC) 20 MG capsule    Sig: Take 1 capsule (20 mg total) by mouth daily.    Dispense:  90 capsule    Refill:  4   diclofenac Sodium (VOLTAREN) 1 % GEL    Sig: Apply 2 g topically 3 (three) times daily. To affected area (wrists)    Dispense:  100 g    Refill:  3   sildenafil (VIAGRA) 100 MG tablet    Sig: Take  1 tablet (100 mg total) by mouth daily as needed for erectile dysfunction.    Dispense:  10 tablet    Refill:  11    Orders Placed This Encounter  Procedures   Ambulatory referral to Urology    Referral Priority:   Routine    Referral Type:   Consultation    Referral Reason:   Specialty Services Required    Requested Specialty:   Urology    Number of Visits Requested:   1   Ambulatory referral to Gastroenterology    Referral Priority:   Routine    Referral Type:   Consultation    Referral Reason:   Specialty Services Required    Number of Visits Requested:   1    Patient Instructions  You may call [facility] at [phone] to schedule an appointment for repeat colonoscopy.  May try viagra 100mg  dose.  We will refer you to to [facility] urology.  Return to see Dr Patsy Lager sports medicine for evaluation of L>R wrist pains. Continue topical diclofenac 2-3 times daily.  Good to see you today Return as needed or in 1 year for next physical   Follow up plan: Return in about 1 year (around 11/17/2023), or if symptoms worsen or fail to improve, for annual exam, prior fasting for blood work.  Eustaquio Boyden, MD

## 2022-11-17 NOTE — Assessment & Plan Note (Signed)
Notes improved since taking over-the-counter supplement-will let me know what it is.

## 2022-11-17 NOTE — Assessment & Plan Note (Signed)
Abstinent since August 2023.  Completed intensive 59-month inpatient rehab summer 2024. Congratulated on continued abstinence.

## 2022-11-17 NOTE — Assessment & Plan Note (Signed)
Reviewed recent cholesterol levels, as well as diet choices to improve control. The 10-year ASCVD risk score (Arnett DK, et al., 2019) is: 8.7%   Values used to calculate the score:     Age: 62 years     Sex: Male     Is Non-Hispanic African American: No     Diabetic: No     Tobacco smoker: No     Systolic Blood Pressure: 126 mmHg     Is BP treated: No     HDL Cholesterol: 42.9 mg/dL     Total Cholesterol: 184 mg/dL

## 2022-11-17 NOTE — Patient Instructions (Addendum)
You may call [facility] at [phone] to schedule an appointment for repeat colonoscopy.  May try viagra 100mg  dose.  We will refer you to to [facility] urology.  Return to see Dr Patsy Lager sports medicine for evaluation of L>R wrist pains. Continue topical diclofenac 2-3 times daily.  Good to see you today Return as needed or in 1 year for next physical

## 2022-11-17 NOTE — Assessment & Plan Note (Signed)
Levels normal off replacement - this has resolved with alcohol cessation - will resolve.

## 2022-11-17 NOTE — Assessment & Plan Note (Signed)
Chronic, stable on daily low dose omeprazole - discussed trying every other day dosing.

## 2022-11-17 NOTE — Assessment & Plan Note (Signed)
10+ polyps on colonoscopy 2022.  Overdue for repeat colonoscopy.  Will refer back to [facility] per patient request.

## 2022-11-17 NOTE — Assessment & Plan Note (Signed)
Stable period, tolerating viagra well but could be more effective, will increase dose to full 100mg  tablet.

## 2022-11-17 NOTE — Assessment & Plan Note (Signed)
Left handed. Bilateral wrist pain present for at least 6 months, more predominant on the left with associated decreased range of motion. X-rays from summer 2024 without obvious wrist fracture but did show significant radiocarpal osteoarthritis changes.  He has been managing with BC powders and topical Voltaren with limited benefit. He also has evidence of right trigger thumb. Discussed options including advanced imaging with CT of wrists, versus further evaluation by sports medicine.  He will schedule sports medicine appointment for further evaluation.

## 2022-11-17 NOTE — Assessment & Plan Note (Signed)
Preventative protocols reviewed and updated unless pt declined. Discussed healthy diet and lifestyle.  

## 2022-11-17 NOTE — Assessment & Plan Note (Signed)
This has resolved since he stopped drinking.  Will resolve from problem list

## 2022-11-17 NOTE — Assessment & Plan Note (Signed)
Stable period on vitamin B12 1000 mcg daily.

## 2022-11-22 NOTE — Progress Notes (Deleted)
    Bradley Gains T. Lil Lepage, MD, CAQ Sports Medicine [facility] at [facility] [address] [place], [address]  Phone: [phone]  FAX: [phone]  Bradley Gonzales. - 62 y.o. male  MRN [id number]  Date of Birth: 22-Jul-1960  Date: 11/23/2022  PCP: Bradley Boyden, MD  Referral: Bradley Boyden, MD  No chief complaint on file.  Subjective:   Bradley Gonzales. is a 62 y.o. very pleasant male patient with There is no height or weight on file to calculate BMI. who presents with the following:  He is a very pleasant gentleman referred to me with some ongoing wrist pain, notably on the left side as well as possible trigger finger.  Left knee x-ray on June 2024 did show some radiocarpal osteoarthritis of the left wrist.    Review of Systems is noted in the HPI, as appropriate  Objective:   There were no vitals taken for this visit.  GEN: No acute distress; alert,appropriate. PULM: Breathing comfortably in no respiratory distress PSYCH: Normally interactive.   Laboratory and Imaging Data:  Assessment and Plan:   ***

## 2022-11-23 ENCOUNTER — Ambulatory Visit: Payer: BLUE CROSS/BLUE SHIELD | Admitting: Family Medicine

## 2022-11-24 NOTE — Progress Notes (Unsigned)
    Bradley Fitzsimmons T. Kaila Devries, MD, CAQ Sports Medicine [facility] at [facility] [address] [place], [address]  Phone: [phone]  FAX: [phone]  [place]acker [place]. - 62 y.o. male  MRN [id number]  Date of Birth: [birth year]  Date: 11/25/2022  PCP: Eustaquio Boyden, MD  Referral: Eustaquio Boyden, MD  No chief complaint on file.  Subjective:   Bradley Schwind. is a 62 y.o. very pleasant male patient with There is no height or weight on file to calculate BMI. who presents with the following:  He is a very pleasant gentleman referred to me with some ongoing wrist pain, notably on the left side as well as possible trigger finger.  Left wrist x-ray on June 2024 did show some radiocarpal osteoarthritis of the left wrist.    Review of Systems is noted in the HPI, as appropriate  Objective:   There were no vitals taken for this visit.  GEN: No acute distress; alert,appropriate. PULM: Breathing comfortably in no respiratory distress PSYCH: Normally interactive.   Laboratory and Imaging Data:  Assessment and Plan:   ***

## 2022-11-25 ENCOUNTER — Ambulatory Visit: Payer: BLUE CROSS/BLUE SHIELD | Admitting: Family Medicine

## 2022-11-25 ENCOUNTER — Encounter: Payer: Self-pay | Admitting: Family Medicine

## 2022-11-25 VITALS — BP 140/78 | HR 78 | Temp 98.8°F | Ht 69.0 in | Wt 189.1 lb

## 2022-11-25 DIAGNOSIS — M65311 Trigger thumb, right thumb: Secondary | ICD-10-CM

## 2022-11-25 DIAGNOSIS — M19032 Primary osteoarthritis, left wrist: Secondary | ICD-10-CM

## 2022-11-30 ENCOUNTER — Encounter: Payer: Self-pay | Admitting: *Deleted

## 2022-12-22 ENCOUNTER — Ambulatory Visit (INDEPENDENT_AMBULATORY_CARE_PROVIDER_SITE_OTHER): Payer: BLUE CROSS/BLUE SHIELD | Admitting: Urology

## 2022-12-22 VITALS — BP 149/85 | HR 82 | Ht 69.0 in | Wt 185.0 lb

## 2022-12-22 DIAGNOSIS — N529 Male erectile dysfunction, unspecified: Secondary | ICD-10-CM | POA: Diagnosis not present

## 2022-12-22 DIAGNOSIS — N4889 Other specified disorders of penis: Secondary | ICD-10-CM

## 2022-12-22 DIAGNOSIS — N486 Induration penis plastica: Secondary | ICD-10-CM

## 2022-12-22 MED ORDER — TADALAFIL 20 MG PO TABS: 20.0000 mg | ORAL_TABLET | Freq: Every day | ORAL | 11 refills | Status: AC | PRN

## 2022-12-22 NOTE — Progress Notes (Signed)
I,Amy L Pierron,acting as a scribe for Vanna Scotland, MD.,have documented all relevant documentation on the behalf of Vanna Scotland, MD,as directed by  Vanna Scotland, MD while in the presence of Vanna Scotland, MD.  12/22/2022 6:50 PM   Bernette Mayers. 22-Oct-1960 [id number]  Referring provider: Eustaquio Boyden, MD [address] [place],  [place] [address]   HPI: 62 year-old male referred for further evaluation of erectile dysfunction.  His referring provider has already prescribed him 100 mg Viagra.   He has a personal history of hyperlipidemia, but no other significant risk factors.  His urine today has 3 to 10 red blood cells per high powered field, otherwise unremarkable.  He mentions that his penis "is not straight anymore", it tilts up when he has an erection. He has noticed it about 3 months ago. He is able to penetrate with the curvature though. He has difficulty maintaining the erection. He is able to achieve it. He hasn't seen much difference in taking the Viagra.   SHIM     Row Name 12/22/22 1545         SHIM: Over the last 6 months:   How do you rate your confidence that you could get and keep an erection? Very Low     When you had erections with sexual stimulation, how often were your erections hard enough for penetration (entering your partner)? Almost Always or Always     During sexual intercourse, how often were you able to maintain your erection after you had penetrated (entered) your partner? Almost Never or Never     During sexual intercourse, how difficult was it to maintain your erection to completion of intercourse? Extremely Difficult     When you attempted sexual intercourse, how often was it satisfactory for you? Almost Always or Always       SHIM Total Score   SHIM 13              PMH: Past Medical History:  Diagnosis Date   Dental abscess 11/17/2011   GERD (gastroesophageal reflux disease)     Surgical History: Past  Surgical History:  Procedure Laterality Date   CHOLECYSTECTOMY  2011   COLONOSCOPY WITH PROPOFOL N/A 02/09/2020   10+TAs, 1 HP, rpt 3 mo given suboptimal bowel prep Wyline Mood, MD)    Home Medications:  Allergies as of 12/22/2022       Reactions   Hydroxyzine Itching        Medication List        Accurate as of December 22, 2022  6:50 PM. If you have any questions, ask your nurse or doctor.          cyanocobalamin 1000 MCG tablet Commonly known as: VITAMIN B12 Take 1 tablet (1,000 mcg total) by mouth daily.   diclofenac Sodium 1 % Gel Commonly known as: VOLTAREN Apply 2 g topically 3 (three) times daily. To affected area (wrists)   MISC NATURAL PRODUCTS PO Take by mouth daily. Beet root   omeprazole 20 MG capsule Commonly known as: PRILOSEC Take 1 capsule (20 mg total) by mouth daily.   sildenafil 100 MG tablet Commonly known as: Viagra Take 1 tablet (100 mg total) by mouth daily as needed for erectile dysfunction.   tadalafil 20 MG tablet Commonly known as: CIALIS Take 1 tablet (20 mg total) by mouth daily as needed for erectile dysfunction.        Allergies:  Allergies  Allergen Reactions   Hydroxyzine Itching  Family History: Family History  Problem Relation Age of Onset   Hypertension Mother    Cancer Mother        ovarian   Cancer Paternal Grandfather        ?lung, smoker   Heart attack Paternal Grandfather    Diabetes Paternal Uncle    Heart failure Maternal Grandfather    Stroke Neg Hx    CAD Neg Hx     Social History:  reports that he quit smoking about 28 years ago. His smoking use included cigarettes. He has been exposed to tobacco smoke. He has never used smokeless tobacco. He reports that he does not currently use alcohol. He reports that he does not use drugs.   Physical Exam: BP (!) 149/85   Pulse 82   Ht 5\' 9"  (1.753 m)   Wt 185 lb (83.9 kg)   BMI 27.32 kg/m   Constitutional:  Alert and oriented, No acute  distress. HEENT: [place] AT, moist mucus membranes.  Trachea midline, no masses. GU: Palpable plaque.  Neurologic: Grossly intact, no focal deficits, moving all 4 extremities. Psychiatric: Normal mood and affect.   Assessment & Plan:    1. Erectile dysfunction  - We discussed the pathophysiology of erectile dysfunction today along with possible contributing factors. Discussed possible treatment options including PDE 5 inhibitors, vacuum erectile device, intracavernosal injection, MUSE, and placement of the inflatable or malleable penile prosthesis for refractory cases.  - In terms of PDE 5 inhibitors, we discussed contraindications for this medication as well as common side effects. Patient was counseled on optimal use. All of his questions were answered in detail.   - Plan to switch from Viagra to Cialis 20 mg to see if that helps any better. Sent prescription to pharmacy.   2. Peyronie's disease  - Based on patient's history of physical exam, findings are consistent with Peyronie's disease.  - Pathophysiology was discussed at length including the 2 phases of the diease, acute and chronic.  - Treatment options and goals of treatment were discussed today in detail. Options including observation, penile plaque and graft, penile plication, placement of penile prosthesis, and injection of collagenase were all reviewed.  An benefits of each were discussed at length.  - Plan to see how it goes over the next 2-3 months and see how if it changes. Gave information on the Xiaflex treatment but he is not interested in pursuing it at this time.   Return if symptoms worsen or fail to improve.   [facility] [address] [place], [place] [address] [phone]
